# Patient Record
Sex: Male | Born: 1950
Health system: Southern US, Community
[De-identification: ages and names within clinical notes are randomized; demographics above are authoritative.]

## PROBLEM LIST (undated history)

## (undated) DIAGNOSIS — Z973 Presence of spectacles and contact lenses: Secondary | ICD-10-CM

## (undated) DIAGNOSIS — I10 Essential (primary) hypertension: Secondary | ICD-10-CM

## (undated) DIAGNOSIS — T7840XA Allergy, unspecified, initial encounter: Secondary | ICD-10-CM

## (undated) DIAGNOSIS — Z87442 Personal history of urinary calculi: Secondary | ICD-10-CM

## (undated) DIAGNOSIS — E785 Hyperlipidemia, unspecified: Secondary | ICD-10-CM

## (undated) DIAGNOSIS — E119 Type 2 diabetes mellitus without complications: Secondary | ICD-10-CM

## (undated) DIAGNOSIS — S24104A Unspecified injury at T11-T12 level of thoracic spinal cord, initial encounter: Secondary | ICD-10-CM

## (undated) DIAGNOSIS — G8220 Paraplegia, unspecified: Secondary | ICD-10-CM

## (undated) DIAGNOSIS — Z993 Dependence on wheelchair: Secondary | ICD-10-CM

## (undated) DIAGNOSIS — N433 Hydrocele, unspecified: Secondary | ICD-10-CM

## (undated) DIAGNOSIS — M1990 Unspecified osteoarthritis, unspecified site: Secondary | ICD-10-CM

## (undated) HISTORY — DX: Paraplegia, unspecified: G82.20

## (undated) HISTORY — DX: Essential (primary) hypertension: I10

## (undated) HISTORY — DX: Hyperlipidemia, unspecified: E78.5

## (undated) HISTORY — DX: Personal history of urinary calculi: Z87.442

## (undated) HISTORY — PX: OTHER SURGICAL HISTORY: SHX169

## (undated) HISTORY — DX: Allergy, unspecified, initial encounter: T78.40XA

---

## 1978-02-09 HISTORY — PX: OTHER SURGICAL HISTORY: SHX169

## 2004-02-14 ENCOUNTER — Ambulatory Visit: Payer: Self-pay | Admitting: Family Medicine

## 2004-03-11 ENCOUNTER — Ambulatory Visit: Payer: Self-pay | Admitting: Family Medicine

## 2004-05-19 ENCOUNTER — Ambulatory Visit: Payer: Self-pay | Admitting: Family Medicine

## 2007-11-09 ENCOUNTER — Encounter: Payer: Self-pay | Admitting: Family Medicine

## 2008-01-16 ENCOUNTER — Ambulatory Visit: Payer: Self-pay | Admitting: *Deleted

## 2008-01-16 ENCOUNTER — Ambulatory Visit: Admission: RE | Admit: 2008-01-16 | Discharge: 2008-01-16 | Payer: Self-pay | Admitting: Family Medicine

## 2008-01-16 ENCOUNTER — Ambulatory Visit: Payer: Self-pay | Admitting: Family Medicine

## 2008-01-16 ENCOUNTER — Encounter: Payer: Self-pay | Admitting: Family Medicine

## 2008-01-16 DIAGNOSIS — Z87442 Personal history of urinary calculi: Secondary | ICD-10-CM | POA: Insufficient documentation

## 2008-01-16 DIAGNOSIS — R519 Headache, unspecified: Secondary | ICD-10-CM | POA: Insufficient documentation

## 2008-01-16 DIAGNOSIS — J309 Allergic rhinitis, unspecified: Secondary | ICD-10-CM | POA: Insufficient documentation

## 2008-01-16 DIAGNOSIS — R609 Edema, unspecified: Secondary | ICD-10-CM | POA: Insufficient documentation

## 2008-01-16 DIAGNOSIS — R51 Headache: Secondary | ICD-10-CM | POA: Insufficient documentation

## 2008-01-16 DIAGNOSIS — I10 Essential (primary) hypertension: Secondary | ICD-10-CM | POA: Insufficient documentation

## 2008-01-17 ENCOUNTER — Telehealth (INDEPENDENT_AMBULATORY_CARE_PROVIDER_SITE_OTHER): Payer: Self-pay | Admitting: *Deleted

## 2008-01-18 ENCOUNTER — Telehealth: Payer: Self-pay | Admitting: Family Medicine

## 2008-01-25 ENCOUNTER — Telehealth: Payer: Self-pay | Admitting: Family Medicine

## 2008-04-06 ENCOUNTER — Ambulatory Visit: Payer: Self-pay | Admitting: Family Medicine

## 2008-05-24 ENCOUNTER — Ambulatory Visit: Payer: Self-pay | Admitting: Family Medicine

## 2008-05-31 ENCOUNTER — Telehealth: Payer: Self-pay | Admitting: Family Medicine

## 2008-05-31 LAB — CONVERTED CEMR LAB
Alkaline Phosphatase: 86 units/L (ref 39–117)
BUN: 18 mg/dL (ref 6–23)
Basophils Absolute: 0.2 10*3/uL — ABNORMAL HIGH (ref 0.0–0.1)
Bilirubin, Direct: 0.1 mg/dL (ref 0.0–0.3)
CO2: 28 meq/L (ref 19–32)
Calcium: 9.3 mg/dL (ref 8.4–10.5)
Cholesterol: 207 mg/dL — ABNORMAL HIGH (ref 0–200)
Creatinine, Ser: 0.8 mg/dL (ref 0.4–1.5)
Direct LDL: 154.6 mg/dL
Eosinophils Absolute: 0.1 10*3/uL (ref 0.0–0.7)
Glucose, Bld: 159 mg/dL — ABNORMAL HIGH (ref 70–99)
Lymphocytes Relative: 23.2 % (ref 12.0–46.0)
MCHC: 34.7 g/dL (ref 30.0–36.0)
MCV: 85.1 fL (ref 78.0–100.0)
Monocytes Absolute: 0.3 10*3/uL (ref 0.1–1.0)
Neutrophils Relative %: 67.3 % (ref 43.0–77.0)
PSA: 2.23 ng/mL (ref 0.10–4.00)
Platelets: 247 10*3/uL (ref 150.0–400.0)
RBC: 4.67 M/uL (ref 4.22–5.81)
RDW: 12.3 % (ref 11.5–14.6)
Total Bilirubin: 1 mg/dL (ref 0.3–1.2)
Total CHOL/HDL Ratio: 6
Triglycerides: 116 mg/dL (ref 0.0–149.0)

## 2008-06-05 ENCOUNTER — Ambulatory Visit: Payer: Self-pay | Admitting: Family Medicine

## 2008-06-05 DIAGNOSIS — E119 Type 2 diabetes mellitus without complications: Secondary | ICD-10-CM | POA: Insufficient documentation

## 2008-06-05 DIAGNOSIS — E782 Mixed hyperlipidemia: Secondary | ICD-10-CM | POA: Insufficient documentation

## 2008-06-08 LAB — CONVERTED CEMR LAB
Creatinine,U: 71 mg/dL
Hgb A1c MFr Bld: 7.2 % — ABNORMAL HIGH (ref 4.6–6.5)
Microalb Creat Ratio: 5.6 mg/g (ref 0.0–30.0)

## 2008-06-11 ENCOUNTER — Ambulatory Visit: Payer: Self-pay | Admitting: Gastroenterology

## 2008-06-21 ENCOUNTER — Encounter: Admission: RE | Admit: 2008-06-21 | Discharge: 2008-06-21 | Payer: Self-pay | Admitting: Family Medicine

## 2008-06-25 ENCOUNTER — Ambulatory Visit: Payer: Self-pay | Admitting: Gastroenterology

## 2008-06-25 ENCOUNTER — Encounter: Payer: Self-pay | Admitting: Gastroenterology

## 2008-06-25 HISTORY — PX: COLONOSCOPY: SHX174

## 2008-06-26 ENCOUNTER — Encounter: Payer: Self-pay | Admitting: Gastroenterology

## 2008-06-26 DIAGNOSIS — K626 Ulcer of anus and rectum: Secondary | ICD-10-CM | POA: Insufficient documentation

## 2008-06-28 ENCOUNTER — Encounter: Payer: Self-pay | Admitting: Gastroenterology

## 2008-07-10 ENCOUNTER — Encounter: Payer: Self-pay | Admitting: Gastroenterology

## 2008-07-31 ENCOUNTER — Ambulatory Visit (HOSPITAL_BASED_OUTPATIENT_CLINIC_OR_DEPARTMENT_OTHER): Admission: RE | Admit: 2008-07-31 | Discharge: 2008-07-31 | Payer: Self-pay | Admitting: Surgery

## 2008-07-31 ENCOUNTER — Encounter (INDEPENDENT_AMBULATORY_CARE_PROVIDER_SITE_OTHER): Payer: Self-pay | Admitting: Surgery

## 2008-07-31 HISTORY — PX: OTHER SURGICAL HISTORY: SHX169

## 2009-05-15 ENCOUNTER — Ambulatory Visit: Payer: Self-pay | Admitting: Family Medicine

## 2009-05-15 LAB — CONVERTED CEMR LAB
Ketones, urine, test strip: NEGATIVE
Nitrite: NEGATIVE
Specific Gravity, Urine: 1.02
Urobilinogen, UA: 0.2

## 2009-05-16 ENCOUNTER — Encounter: Payer: Self-pay | Admitting: Family Medicine

## 2009-05-17 LAB — CONVERTED CEMR LAB
ALT: 27 units/L (ref 0–53)
AST: 21 units/L (ref 0–37)
Albumin: 3.7 g/dL (ref 3.5–5.2)
Alkaline Phosphatase: 76 units/L (ref 39–117)
Basophils Relative: 0.5 % (ref 0.0–3.0)
Bilirubin, Direct: 0.1 mg/dL (ref 0.0–0.3)
CO2: 27 meq/L (ref 19–32)
Calcium: 8.5 mg/dL (ref 8.4–10.5)
Creatinine, Ser: 0.8 mg/dL (ref 0.4–1.5)
Eosinophils Relative: 2.6 % (ref 0.0–5.0)
HDL: 37.6 mg/dL — ABNORMAL LOW (ref >39.00)
Hemoglobin: 13.6 g/dL (ref 13.0–17.0)
Hgb A1c MFr Bld: 6.6 % — ABNORMAL HIGH (ref 4.6–6.5)
LDL Cholesterol: 113 mg/dL — ABNORMAL HIGH (ref 0–99)
Lymphocytes Relative: 21.3 % (ref 12.0–46.0)
Microalb, Ur: 9.5 mg/dL — ABNORMAL HIGH (ref 0.0–1.9)
Monocytes Relative: 4.6 % (ref 3.0–12.0)
Neutro Abs: 4.1 10*3/uL (ref 1.4–7.7)
Neutrophils Relative %: 71 % (ref 43.0–77.0)
RBC: 4.57 M/uL (ref 4.22–5.81)
Sodium: 139 meq/L (ref 135–145)
Total CHOL/HDL Ratio: 5
Total Protein: 7.1 g/dL (ref 6.0–8.3)
Triglycerides: 95 mg/dL (ref 0.0–149.0)
WBC: 5.8 10*3/uL (ref 4.5–10.5)

## 2009-05-27 ENCOUNTER — Ambulatory Visit: Payer: Self-pay | Admitting: Family Medicine

## 2010-03-11 NOTE — Assessment & Plan Note (Signed)
Summary: cpx//ccm   Vital Signs:  Patient profile:   61 year old male Weight:      230 pounds BMI:     31.31 Temp:     98.6 degrees F oral BP sitting:   130 / 90  (right arm) Cuff size:   regular  Vitals Entered By: Raechel Ache, RN (May 27, 2009 9:23 AM) CC: CPX, labs done. Needs refills.   History of Present Illness: 60 yr old male for a cpx. In general he feels well. His wife was recently found to have heart disease, and both of them have started eating a more healthy diet lately.   Allergies (verified): No Known Drug Allergies  Past History:  Past Medical History: Reviewed history from 06/05/2008 and no changes required. Allergic rhinitis Headache Nephrolithiasis, hx of Hypertension paraplegia, wheelchair bound Diabetes mellitus, type II Hyperlipidemia  Past Surgical History: Lysis of the spinal cord at T12 caused from falling out of a tree in 1980 Vasectomy colonoscopy 06-25-08 per Dr. Wendall Papa, benign polyp, repeat 10 years  Hemorrhoidectomy with biopsy of a benign anal ulcer June 2010 per Dr. Manus Rudd  Family History: Reviewed history from 01/16/2008 and no changes required. Family History of CAD Male 1st degree relative <50 Family History Diabetes 1st degree relative Family History Hypertension Family History Liver disease Family History of Stroke F 1st degree relative <60 Family History Thyroid disease  Social History: Reviewed history from 01/16/2008 and no changes required. Married Current Smoker Alcohol use-yes Drug use-no  Review of Systems  The patient denies anorexia, fever, weight loss, weight gain, vision loss, decreased hearing, hoarseness, chest pain, syncope, dyspnea on exertion, peripheral edema, prolonged cough, headaches, hemoptysis, abdominal pain, melena, hematochezia, severe indigestion/heartburn, hematuria, incontinence, genital sores, muscle weakness, suspicious skin lesions, transient blindness, depression, unusual  weight change, abnormal bleeding, enlarged lymph nodes, angioedema, breast masses, and testicular masses.    Physical Exam  General:  overweight-appearing.  in a wheelchair but he can stand briefly Head:  Normocephalic and atraumatic without obvious abnormalities. No apparent alopecia or balding. Eyes:  No corneal or conjunctival inflammation noted. EOMI. Perrla. Funduscopic exam benign, without hemorrhages, exudates or papilledema. Vision grossly normal. Ears:  External ear exam shows no significant lesions or deformities.  Otoscopic examination reveals clear canals, tympanic membranes are intact bilaterally without bulging, retraction, inflammation or discharge. Hearing is grossly normal bilaterally. Nose:  External nasal examination shows no deformity or inflammation. Nasal mucosa are pink and moist without lesions or exudates. Mouth:  Oral mucosa and oropharynx without lesions or exudates.  Teeth in good repair. Neck:  No deformities, masses, or tenderness noted. Chest Wall:  No deformities, masses, tenderness or gynecomastia noted. Lungs:  Normal respiratory effort, chest expands symmetrically. Lungs are clear to auscultation, no crackles or wheezes. Heart:  Normal rate and regular rhythm. S1 and S2 normal without gallop, murmur, click, rub or other extra sounds. EKG normal Abdomen:  Bowel sounds positive,abdomen soft and non-tender without masses, organomegaly or hernias noted. Rectal:  No external abnormalities noted. Normal sphincter tone. No rectal masses or tenderness. Heme neg.  Genitalia:  Testes bilaterally descended without nodularity, tenderness or masses. No scrotal masses or lesions. No penis lesions or urethral discharge. Prostate:  Prostate gland firm and smooth, no enlargement, nodularity, tenderness, mass, asymmetry or induration. Msk:  No deformity or scoliosis noted of thoracic or lumbar spine.   Pulses:  R and L carotid,radial,femoral,dorsalis pedis and posterior tibial  pulses are full and equal bilaterally Extremities:  No clubbing, cyanosis, edema, or deformity noted with normal full range of motion of all joints.   Neurologic:  alert & oriented X3 and cranial nerves II-XII intact. Strength and sensation are intact to the arms, legs show 2/5 for motor strength and decreased light touch sensation. DTRs intact to the arms but not the legs.  Skin:  Intact without suspicious lesions or rashes Cervical Nodes:  No lymphadenopathy noted Axillary Nodes:  No palpable lymphadenopathy Inguinal Nodes:  No significant adenopathy Psych:  Cognition and judgment appear intact. Alert and cooperative with normal attention span and concentration. No apparent delusions, illusions, hallucinations   Impression & Recommendations:  Problem # 1:  WELL ADULT EXAM (ICD-V70.0)  Orders: Hemoccult Guaiac-1 spec.(in office) (82270) EKG w/ Interpretation (93000)  Complete Medication List: 1)  Tarka 4-240 Mg Tbcr (Trandolapril-verapamil hcl) .Marland Kitchen.. 1 by mouth once daily 2)  Metoprolol Tartrate 100 Mg Tabs (Metoprolol tartrate) .Marland Kitchen.. 1 by mouth two times a day 3)  Hydrochlorothiazide 25 Mg Tabs (Hydrochlorothiazide) .... Once daily 4)  Bayer Aspirin 325 Mg Tabs (Aspirin) .... Once daily 5)  Doxycycline Hyclate 100 Mg Caps (Doxycycline hyclate) .... Two times a day  Patient Instructions: 1)  Watch diet closely and recheck lipids and A1c in 6 months.  Prescriptions: DOXYCYCLINE HYCLATE 100 MG CAPS (DOXYCYCLINE HYCLATE) two times a day  #28 x 0   Entered and Authorized by:   Nelwyn Salisbury MD   Signed by:   Nelwyn Salisbury MD on 05/27/2009   Method used:   Electronically to        [facility] [address] [phone]* (retail)       [address] [place], [place]  [address]       Ph: [phone]       Fax: [phone]   RxID:   [id number] HYDROCHLOROTHIAZIDE 25 MG TABS (HYDROCHLOROTHIAZIDE) once daily  #30 x 11   Entered and Authorized by:   Nelwyn Salisbury MD   Signed by:   Nelwyn Salisbury  MD on 05/27/2009   Method used:   Electronically to        [facility] [address] [phone]* (retail)       [address] [place], [place]  [address]       Ph: [phone]       Fax: [phone]   RxID:   [id number] METOPROLOL TARTRATE 100 MG  TABS (METOPROLOL TARTRATE) 1 by mouth two times a day  #60 x 11   Entered and Authorized by:   Nelwyn Salisbury MD   Signed by:   Nelwyn Salisbury MD on 05/27/2009   Method used:   Electronically to        [facility] [address] [phone]* (retail)       [address] [place], [place]  [address]       Ph: [phone]       Fax: [phone]   RxID:   [id number] TARKA 4-240 MG  TBCR (TRANDOLAPRIL-VERAPAMIL HCL) 1 by mouth once daily  #30 x 11   Entered and Authorized by:   Nelwyn Salisbury MD   Signed by:   Nelwyn Salisbury MD on 05/27/2009   Method used:   Electronically to        [facility] [address] [phone]* (retail)       [address]  [place], [place]  [address]       Ph: [phone]       Fax: [phone]   RxID:   [id number]

## 2010-05-19 LAB — BASIC METABOLIC PANEL
BUN: 16 mg/dL (ref 6–23)
Chloride: 103 mEq/L (ref 96–112)
Creatinine, Ser: 0.85 mg/dL (ref 0.4–1.5)
Glucose, Bld: 207 mg/dL — ABNORMAL HIGH (ref 70–99)

## 2010-05-19 LAB — CBC
HCT: 39.2 % (ref 39.0–52.0)
MCHC: 34.3 g/dL (ref 30.0–36.0)
MCV: 85 fL (ref 78.0–100.0)
Platelets: 249 10*3/uL (ref 150–400)
RDW: 13.6 % (ref 11.5–15.5)
WBC: 5.9 10*3/uL (ref 4.0–10.5)

## 2010-05-19 LAB — DIFFERENTIAL
Basophils Absolute: 0 10*3/uL (ref 0.0–0.1)
Basophils Relative: 1 % (ref 0–1)
Eosinophils Absolute: 0.1 10*3/uL (ref 0.0–0.7)
Eosinophils Relative: 2 % (ref 0–5)
Neutrophils Relative %: 71 % (ref 43–77)

## 2010-05-19 LAB — POCT HEMOGLOBIN-HEMACUE: Hemoglobin: 12.7 g/dL — ABNORMAL LOW (ref 13.0–17.0)

## 2010-05-28 ENCOUNTER — Other Ambulatory Visit: Payer: Self-pay | Admitting: Family Medicine

## 2010-06-24 NOTE — Op Note (Signed)
NAMEBRENNER, VISCONTI                  ACCOUNT NO.:  [id number]   MEDICAL RECORD NO.:  [id number]          PATIENT TYPE:  AMB   LOCATION:  [facility]                          FACILITY:  [facility]   PHYSICIAN:  Wilmon Arms. Corliss Skains, M.D. DATE OF BIRTH:  01-15-1951   DATE OF PROCEDURE:  07/31/2008  DATE OF DISCHARGE:                               OPERATIVE REPORT   PREOPERATIVE DIAGNOSIS:  Enlarged internal and external hemorrhoid.   POSTOPERATIVE DIAGNOSES:  1. Enlarged internal and external hemorrhoid.  2. Perianal skin lesion.   PROCEDURE PERFORMED:  1. Single-column internal and external hemorrhoidectomy.  2. Excision of perianal skin lesion.   SURGEON:  Wilmon Arms. Corliss Skains, MD   ANESTHESIA:  General.   INDICATIONS:  This is a 60 year old male who has lower extremity  paraplegia from a spinal injury many years ago.  He recently underwent a  screening colonoscopy.  He was noted to have a mucosal ulceration and  skin lesion just inside a large hemorrhoid.  He is referred for  hemorrhoidectomy and biopsy.   DESCRIPTION OF PROCEDURE:  The patient was brought to the operating room  and placed in the supine position on the operating table.  After an  adequate level of general anesthesia was obtained, the patient's legs  were placed in the lithotomy position.  His perineum was prepped with  Betadine and draped in a sterile fashion.  He was noted to have a 2-cm  protruding skin lesion posterior to his anus.  We infiltrated the area  around the anus and the intersphincteric groove with 0.25% Marcaine with  epinephrine.  Well also injected some around the skin lesion  posteriorly.  We inserted a silver bullet retractor and did a  circumferential visual examination.  In the left posterior region, the  patient has a large prolapsing internal and external hemorrhoid.  At the  apex of this area, he has a whitish plaque-like lesion on the mucosa.  We made decision to perform a single-column hemorrhoidectomy  including  this mucosal lesion.  We placed a 3-0 Vicryl suture above the mucosal  lesion.  The mucosa and the anoderm were scored with a knife.  Cautery  was then used to dissect the hemorrhoid tissue off the sphincter  muscles.  Hemostasis was then obtained with cautery.  A 3-0 Vicryl was  used to reapproximate the mucosal edges.  We used another 3-0 Vicryl in  the anoderm.  Gelfoam was packed in the anal canal.  We then excised the  skin lesion posteriorly.  This  had a fairly wide base and we decided to just leave it open.  Both  specimens were sent for pathologic examination.  The patient was then  extubated and brought to the recovery room in stable condition.  All  sponge, instrument, and needle counts were correct.      Wilmon Arms. Tsuei, M.D.  Electronically Signed     MKT/MEDQ  D:  07/31/2008  T:  07/31/2008  Job:  [id number]

## 2010-06-27 ENCOUNTER — Other Ambulatory Visit: Payer: Self-pay | Admitting: Family Medicine

## 2010-07-26 ENCOUNTER — Other Ambulatory Visit: Payer: Self-pay | Admitting: Family Medicine

## 2010-08-25 ENCOUNTER — Other Ambulatory Visit: Payer: Self-pay | Admitting: Family Medicine

## 2010-09-26 ENCOUNTER — Other Ambulatory Visit: Payer: Self-pay | Admitting: Family Medicine

## 2010-10-24 ENCOUNTER — Other Ambulatory Visit: Payer: Self-pay | Admitting: Family Medicine

## 2010-10-24 NOTE — Telephone Encounter (Signed)
Spoke with pt and he is going to schedule a office visit.

## 2010-11-19 ENCOUNTER — Other Ambulatory Visit (INDEPENDENT_AMBULATORY_CARE_PROVIDER_SITE_OTHER): Payer: BC Managed Care – PPO

## 2010-11-19 DIAGNOSIS — Z0000 Encounter for general adult medical examination without abnormal findings: Secondary | ICD-10-CM

## 2010-11-19 LAB — BASIC METABOLIC PANEL
Calcium: 8.9 mg/dL (ref 8.4–10.5)
Creatinine, Ser: 0.7 mg/dL (ref 0.4–1.5)
GFR: 115.01 mL/min (ref >60.00)
Sodium: 135 mEq/L (ref 135–145)

## 2010-11-19 LAB — HEPATIC FUNCTION PANEL
AST: 19 U/L (ref 0–37)
Albumin: 4.1 g/dL (ref 3.5–5.2)
Alkaline Phosphatase: 81 U/L (ref 39–117)
Bilirubin, Direct: 0.1 mg/dL (ref 0.0–0.3)
Total Protein: 7.4 g/dL (ref 6.0–8.3)

## 2010-11-19 LAB — POCT URINALYSIS DIPSTICK
Ketones, UA: NEGATIVE
Spec Grav, UA: 1.02
Urobilinogen, UA: 0.2
pH, UA: 5.5

## 2010-11-19 LAB — CBC WITH DIFFERENTIAL/PLATELET
Basophils Absolute: 0 10*3/uL (ref 0.0–0.1)
Basophils Relative: 0.6 % (ref 0.0–3.0)
Eosinophils Absolute: 0.1 10*3/uL (ref 0.0–0.7)
Hemoglobin: 13.2 g/dL (ref 13.0–17.0)
Lymphocytes Relative: 25.2 % (ref 12.0–46.0)
MCHC: 33.3 g/dL (ref 30.0–36.0)
Monocytes Relative: 5.5 % (ref 3.0–12.0)
Neutro Abs: 3.9 10*3/uL (ref 1.4–7.7)
Neutrophils Relative %: 66.8 % (ref 43.0–77.0)
RBC: 4.61 Mil/uL (ref 4.22–5.81)

## 2010-11-19 LAB — LIPID PANEL
HDL: 40.9 mg/dL (ref >39.00)
Total CHOL/HDL Ratio: 5
Triglycerides: 116 mg/dL (ref 0.0–149.0)

## 2010-11-19 LAB — MICROALBUMIN / CREATININE URINE RATIO
Creatinine,U: 119 mg/dL
Microalb, Ur: 16.5 mg/dL — ABNORMAL HIGH (ref 0.0–1.9)

## 2010-11-21 ENCOUNTER — Telehealth: Payer: Self-pay | Admitting: Family Medicine

## 2010-11-21 MED ORDER — CIPROFLOXACIN HCL 500 MG PO TABS: 500.0000 mg | ORAL_TABLET | Freq: Two times a day (BID) | ORAL | Status: AC

## 2010-11-21 NOTE — Telephone Encounter (Signed)
Spoke with pt and gave results, also sent script e-scribe.

## 2010-11-21 NOTE — Progress Notes (Signed)
Addended by: Aniceto Boss A on: 11/21/2010 02:29 PM   Modules accepted: Orders

## 2010-11-21 NOTE — Telephone Encounter (Signed)
Message copied by Baldemar Friday on Fri Nov 21, 2010  2:29 PM ------      Message from: Gershon Crane A      Created: Thu Nov 20, 2010  8:36 AM       His diabetes is out of control, his chol is too high, and he has a UTI. Call in Cipro 500 mg bid for 10 days. We will discuss the other things at the cpx

## 2010-11-26 ENCOUNTER — Ambulatory Visit (INDEPENDENT_AMBULATORY_CARE_PROVIDER_SITE_OTHER): Payer: BC Managed Care – PPO | Admitting: Family Medicine

## 2010-11-26 ENCOUNTER — Encounter: Payer: Self-pay | Admitting: Family Medicine

## 2010-11-26 VITALS — BP 122/80 | HR 62 | Temp 98.6°F | Wt 229.0 lb

## 2010-11-26 DIAGNOSIS — Z136 Encounter for screening for cardiovascular disorders: Secondary | ICD-10-CM

## 2010-11-26 DIAGNOSIS — Z0000 Encounter for general adult medical examination without abnormal findings: Secondary | ICD-10-CM

## 2010-11-26 DIAGNOSIS — Z23 Encounter for immunization: Secondary | ICD-10-CM

## 2010-11-26 MED ORDER — AMLODIPINE BESYLATE 10 MG PO TABS: 10.0000 mg | ORAL_TABLET | Freq: Every day | ORAL | Status: DC

## 2010-11-26 MED ORDER — HYDROCHLOROTHIAZIDE 25 MG PO TABS: 25.0000 mg | ORAL_TABLET | Freq: Every day | ORAL | Status: DC

## 2010-11-26 MED ORDER — METOPROLOL TARTRATE 100 MG PO TABS: 100.0000 mg | ORAL_TABLET | Freq: Two times a day (BID) | ORAL | Status: DC

## 2010-11-26 MED ORDER — TRANDOLAPRIL-VERAPAMIL HCL ER 4-240 MG PO TBCR: 1.0000 | EXTENDED_RELEASE_TABLET | Freq: Every day | ORAL | Status: DC

## 2010-11-26 MED ORDER — METFORMIN HCL 1000 MG PO TABS: 1000.0000 mg | ORAL_TABLET | Freq: Two times a day (BID) | ORAL | Status: DC

## 2010-11-26 MED ORDER — ATORVASTATIN CALCIUM 20 MG PO TABS: 20.0000 mg | ORAL_TABLET | Freq: Every day | ORAL | Status: DC

## 2010-11-26 NOTE — Progress Notes (Signed)
  Subjective:    Patient ID: Dwayne Simpson, male    DOB: 08/15/1950, 60 y.o.   MRN: [id number]  HPI 60 yr old male for a cpx. He feels fine and has no complaints. He works full time and tries to exercise at the gym.    Review of Systems  Eyes: Negative.   Respiratory: Negative.   Cardiovascular: Negative.   Gastrointestinal: Negative.   Genitourinary: Negative.   Musculoskeletal: Negative.   Skin: Negative.   Neurological: Positive for weakness. Negative for dizziness, tremors, seizures, syncope, facial asymmetry, speech difficulty, light-headedness, numbness and headaches.  Hematological: Negative.   Psychiatric/Behavioral: Negative.        Objective:   Physical Exam  Constitutional: He is oriented to person, place, and time. He appears well-developed and well-nourished. No distress.       In his wheelchair   HENT:  Head: Normocephalic and atraumatic.  Right Ear: External ear normal.  Left Ear: External ear normal.  Nose: Nose normal.  Mouth/Throat: Oropharynx is clear and moist. No oropharyngeal exudate.  Eyes: Conjunctivae and EOM are normal. Pupils are equal, round, and reactive to light. Right eye exhibits no discharge. Left eye exhibits no discharge. No scleral icterus.  Neck: Neck supple. No JVD present. No tracheal deviation present. No thyromegaly present.  Cardiovascular: Normal rate, regular rhythm, normal heart sounds and intact distal pulses.  Exam reveals no gallop and no friction rub.   No murmur heard.      EKG normal  Pulmonary/Chest: Effort normal and breath sounds normal. No respiratory distress. He has no wheezes. He has no rales. He exhibits no tenderness.  Abdominal: Soft. Bowel sounds are normal. He exhibits no distension and no mass. There is no tenderness. There is no rebound and no guarding.  Genitourinary: Rectum normal, prostate normal and penis normal. Guaiac negative stool. No penile tenderness.  Musculoskeletal: Normal range of motion. He exhibits  no edema and no tenderness.  Lymphadenopathy:    He has no cervical adenopathy.  Neurological: He is alert and oriented to person, place, and time. He has normal reflexes. No cranial nerve deficit. He exhibits abnormal muscle tone. Coordination normal.  Skin: Skin is warm and dry. No rash noted. He is not diaphoretic. No erythema. No pallor.  Psychiatric: He has a normal mood and affect. His behavior is normal. Judgment and thought content normal.          Assessment & Plan:  Well exam. He is taking Cipro for a UTI. Start on Lipitor for the lipids and on Metformin for the diabetes. Recheck labs in 90 days

## 2011-04-23 ENCOUNTER — Other Ambulatory Visit: Payer: Self-pay | Admitting: Family Medicine

## 2011-05-25 ENCOUNTER — Other Ambulatory Visit: Payer: Self-pay | Admitting: Family Medicine

## 2011-11-19 ENCOUNTER — Other Ambulatory Visit: Payer: Self-pay | Admitting: Family Medicine

## 2011-12-09 ENCOUNTER — Other Ambulatory Visit (INDEPENDENT_AMBULATORY_CARE_PROVIDER_SITE_OTHER): Payer: BC Managed Care – PPO

## 2011-12-09 DIAGNOSIS — Z0000 Encounter for general adult medical examination without abnormal findings: Secondary | ICD-10-CM

## 2011-12-09 LAB — CBC WITH DIFFERENTIAL/PLATELET
Basophils Absolute: 0 10*3/uL (ref 0.0–0.1)
Eosinophils Absolute: 0.2 10*3/uL (ref 0.0–0.7)
Lymphocytes Relative: 28.8 % (ref 12.0–46.0)
MCHC: 33.7 g/dL (ref 30.0–36.0)
MCV: 84.8 fl (ref 78.0–100.0)
Monocytes Absolute: 0.3 10*3/uL (ref 0.1–1.0)
Neutrophils Relative %: 61.7 % (ref 43.0–77.0)
Platelets: 234 10*3/uL (ref 150.0–400.0)

## 2011-12-09 LAB — HEPATIC FUNCTION PANEL
Alkaline Phosphatase: 70 U/L (ref 39–117)
Bilirubin, Direct: 0.2 mg/dL (ref 0.0–0.3)
Total Bilirubin: 1.1 mg/dL (ref 0.3–1.2)
Total Protein: 7.2 g/dL (ref 6.0–8.3)

## 2011-12-09 LAB — MICROALBUMIN / CREATININE URINE RATIO: Microalb Creat Ratio: 1.5 mg/g (ref 0.0–30.0)

## 2011-12-09 LAB — POCT URINALYSIS DIPSTICK
Blood, UA: NEGATIVE
Glucose, UA: NEGATIVE
Leukocytes, UA: NEGATIVE
Nitrite, UA: NEGATIVE
Urobilinogen, UA: 0.2

## 2011-12-09 LAB — BASIC METABOLIC PANEL
BUN: 19 mg/dL (ref 6–23)
CO2: 25 mEq/L (ref 19–32)
Calcium: 9.1 mg/dL (ref 8.4–10.5)
Chloride: 103 mEq/L (ref 96–112)
Creatinine, Ser: 0.8 mg/dL (ref 0.4–1.5)

## 2011-12-09 LAB — TSH: TSH: 1.09 u[IU]/mL (ref 0.35–5.50)

## 2011-12-09 LAB — PSA: PSA: 0.87 ng/mL (ref 0.10–4.00)

## 2011-12-09 LAB — LIPID PANEL
Total CHOL/HDL Ratio: 4
Triglycerides: 107 mg/dL (ref 0.0–149.0)

## 2011-12-11 NOTE — Progress Notes (Signed)
Quick Note:  I spoke with pt ______ 

## 2011-12-16 ENCOUNTER — Ambulatory Visit (INDEPENDENT_AMBULATORY_CARE_PROVIDER_SITE_OTHER): Payer: BC Managed Care – PPO | Admitting: Family Medicine

## 2011-12-16 ENCOUNTER — Encounter: Payer: Self-pay | Admitting: Family Medicine

## 2011-12-16 VITALS — BP 118/72 | HR 62 | Temp 98.6°F

## 2011-12-16 DIAGNOSIS — N5089 Other specified disorders of the male genital organs: Secondary | ICD-10-CM

## 2011-12-16 DIAGNOSIS — Z0000 Encounter for general adult medical examination without abnormal findings: Secondary | ICD-10-CM

## 2011-12-16 DIAGNOSIS — Z23 Encounter for immunization: Secondary | ICD-10-CM

## 2011-12-16 MED ORDER — AMLODIPINE BESYLATE 10 MG PO TABS: 10.0000 mg | ORAL_TABLET | Freq: Every day | ORAL | Status: DC

## 2011-12-16 MED ORDER — METFORMIN HCL 1000 MG PO TABS: 1000.0000 mg | ORAL_TABLET | Freq: Two times a day (BID) | ORAL | Status: DC

## 2011-12-16 MED ORDER — METOPROLOL TARTRATE 100 MG PO TABS: 100.0000 mg | ORAL_TABLET | Freq: Two times a day (BID) | ORAL | Status: DC

## 2011-12-16 MED ORDER — ATORVASTATIN CALCIUM 20 MG PO TABS: 20.0000 mg | ORAL_TABLET | Freq: Every day | ORAL | Status: DC

## 2011-12-16 MED ORDER — ASPIRIN 81 MG PO TABS: 81.0000 mg | ORAL_TABLET | Freq: Every day | ORAL | Status: DC

## 2011-12-16 MED ORDER — HYDROCHLOROTHIAZIDE 25 MG PO TABS: 25.0000 mg | ORAL_TABLET | Freq: Every day | ORAL | Status: DC

## 2011-12-16 MED ORDER — LISINOPRIL 20 MG PO TABS: 20.0000 mg | ORAL_TABLET | Freq: Every day | ORAL | Status: DC

## 2011-12-16 NOTE — Progress Notes (Signed)
  Subjective:    Patient ID: Dwayne Simpson, male    DOB: 1950-11-01, 61 y.o.   MRN: [id number]  HPI 61 yr old male for a cpx. He feels well and has no concerns. On our exam below we found a large scrotal swelling. He is unaware of this and he does not know how long it has been present. It does not bother him.    Review of Systems  Constitutional: Negative.   HENT: Negative.   Eyes: Negative.   Respiratory: Negative.   Cardiovascular: Negative.   Gastrointestinal: Negative.   Genitourinary: Negative.   Musculoskeletal: Negative.   Skin: Negative.   Neurological: Negative.   Hematological: Negative.   Psychiatric/Behavioral: Negative.        Objective:   Physical Exam  Constitutional: He is oriented to person, place, and time. He appears well-developed and well-nourished. No distress.  HENT:  Head: Normocephalic and atraumatic.  Right Ear: External ear normal.  Left Ear: External ear normal.  Nose: Nose normal.  Mouth/Throat: Oropharynx is clear and moist. No oropharyngeal exudate.  Eyes: Conjunctivae normal and EOM are normal. Pupils are equal, round, and reactive to light. Right eye exhibits no discharge. Left eye exhibits no discharge. No scleral icterus.  Neck: Neck supple. No JVD present. No tracheal deviation present. No thyromegaly present.  Cardiovascular: Normal rate, regular rhythm, normal heart sounds and intact distal pulses.  Exam reveals no gallop and no friction rub.   No murmur heard. Pulmonary/Chest: Effort normal and breath sounds normal. No respiratory distress. He has no wheezes. He has no rales. He exhibits no tenderness.  Abdominal: Soft. Bowel sounds are normal. He exhibits no distension and no mass. There is no tenderness. There is no rebound and no guarding.  Genitourinary: Rectum normal, prostate normal and penis normal. Guaiac negative stool. No penile tenderness.       There is a large cystic mass in the right scrotum which is not tender. I cannot  palpate the right testicle. This seems to be a hydrocele.  Musculoskeletal: Normal range of motion. He exhibits no edema and no tenderness.  Lymphadenopathy:    He has no cervical adenopathy.  Neurological: He is alert and oriented to person, place, and time. He has normal reflexes. No cranial nerve deficit. He exhibits normal muscle tone. Coordination normal.  Skin: Skin is warm and dry. No rash noted. He is not diaphoretic. No erythema. No pallor.  Psychiatric: He has a normal mood and affect. His behavior is normal. Judgment and thought content normal.          Assessment & Plan:  Well exam. He will continue with dietary and exercise efforts. Set up a scrotal US to evaluate the scrotal mass.

## 2011-12-17 ENCOUNTER — Encounter: Payer: Self-pay | Admitting: Family Medicine

## 2011-12-17 NOTE — Progress Notes (Signed)
  Subjective:    Patient ID: Dwayne Simpson, male    DOB: 04-22-1950, 61 y.o.   MRN: [id number]  HPI    Review of Systems     Objective:   Physical Exam  Cardiovascular:       EKG normal           Assessment & Plan:

## 2011-12-24 ENCOUNTER — Ambulatory Visit
Admission: RE | Admit: 2011-12-24 | Discharge: 2011-12-24 | Disposition: A | Discharge status: Home / Self Care | Payer: BC Managed Care – PPO | Source: Ambulatory Visit | Attending: Family Medicine | Admitting: Family Medicine

## 2011-12-24 DIAGNOSIS — N5089 Other specified disorders of the male genital organs: Secondary | ICD-10-CM

## 2011-12-28 NOTE — Progress Notes (Signed)
Quick Note:  I spoke with pt ______ 

## 2012-12-15 ENCOUNTER — Other Ambulatory Visit: Payer: Self-pay

## 2012-12-20 ENCOUNTER — Other Ambulatory Visit: Payer: Self-pay | Admitting: Family Medicine

## 2012-12-20 NOTE — Telephone Encounter (Signed)
Pt does have a CPE scheduled for December 2014 with Dr. Clent Ridges. I sent in scripts for a 90 day supply because pt uses a mail order.

## 2012-12-31 ENCOUNTER — Encounter (HOSPITAL_COMMUNITY): Payer: Self-pay | Admitting: Emergency Medicine

## 2012-12-31 ENCOUNTER — Emergency Department (HOSPITAL_COMMUNITY): Payer: BC Managed Care – PPO

## 2012-12-31 DIAGNOSIS — S91309A Unspecified open wound, unspecified foot, initial encounter: Secondary | ICD-10-CM | POA: Insufficient documentation

## 2012-12-31 DIAGNOSIS — E119 Type 2 diabetes mellitus without complications: Secondary | ICD-10-CM | POA: Insufficient documentation

## 2012-12-31 DIAGNOSIS — I10 Essential (primary) hypertension: Secondary | ICD-10-CM | POA: Insufficient documentation

## 2012-12-31 DIAGNOSIS — G8220 Paraplegia, unspecified: Secondary | ICD-10-CM | POA: Insufficient documentation

## 2012-12-31 DIAGNOSIS — W010XXA Fall on same level from slipping, tripping and stumbling without subsequent striking against object, initial encounter: Secondary | ICD-10-CM | POA: Insufficient documentation

## 2012-12-31 DIAGNOSIS — Z79899 Other long term (current) drug therapy: Secondary | ICD-10-CM | POA: Insufficient documentation

## 2012-12-31 DIAGNOSIS — Z87891 Personal history of nicotine dependence: Secondary | ICD-10-CM | POA: Insufficient documentation

## 2012-12-31 DIAGNOSIS — Z7982 Long term (current) use of aspirin: Secondary | ICD-10-CM | POA: Insufficient documentation

## 2012-12-31 DIAGNOSIS — Y9389 Activity, other specified: Secondary | ICD-10-CM | POA: Insufficient documentation

## 2012-12-31 DIAGNOSIS — E785 Hyperlipidemia, unspecified: Secondary | ICD-10-CM | POA: Insufficient documentation

## 2012-12-31 DIAGNOSIS — Y92009 Unspecified place in unspecified non-institutional (private) residence as the place of occurrence of the external cause: Secondary | ICD-10-CM | POA: Insufficient documentation

## 2012-12-31 NOTE — ED Notes (Signed)
Pt c/o LAC on bottom of foot. Bleeding controlled. Pt has hx of spinal cord injury. Does not have much feeling below hips.

## 2013-01-01 ENCOUNTER — Emergency Department (HOSPITAL_COMMUNITY)
Admission: EM | Admit: 2013-01-01 | Discharge: 2013-01-01 | Disposition: A | Discharge status: Home / Self Care | Payer: BC Managed Care – PPO | Attending: Emergency Medicine | Admitting: Emergency Medicine

## 2013-01-01 DIAGNOSIS — S91311A Laceration without foreign body, right foot, initial encounter: Secondary | ICD-10-CM

## 2013-01-01 MED ORDER — TETANUS-DIPHTH-ACELL PERTUSSIS 5-2.5-18.5 LF-MCG/0.5 IM SUSP
0.5000 mL | Freq: Once | INTRAMUSCULAR | Status: AC
  Administered 2013-01-01: 0.5 mL via INTRAMUSCULAR
  Filled 2013-01-01: qty 0.5

## 2013-01-01 NOTE — ED Notes (Signed)
Pt states he was at home in the bathroom and slid out of wheelchair some how making a laceration across the bottom of right foot around great toe. Pt states he is unable to feel pain due to being paraplegic. Pt family states right ankle more swollen than normal.

## 2013-01-01 NOTE — ED Notes (Signed)
PA at bedside.

## 2013-01-01 NOTE — ED Notes (Signed)
Discharge instructions reviewed. Unable to sign due to epic not allowing sign on.

## 2013-01-01 NOTE — ED Provider Notes (Signed)
CSN: [id number]     Arrival date & time 12/31/12  2107 History   First MD Initiated Contact with Patient 01/01/13 0127     Chief Complaint  Patient presents with  . Laceration   (Consider location/radiation/quality/duration/timing/severity/associated sxs/prior Treatment) HPI Comments: Patient is a 62 year old gentleman with a history of paraplegia secondary to spinal cord injury, wheelchair-bound, who presents for a laceration to the plantar aspect of his proximal right great toe. Patient states that the laceration was sustained this evening when he slipped in the bathroom. Patient denies any pain to the area as he states he has no sensation in his feet b/l which is his baseline. He denies any modifying factors the symptoms. He denies the use of blood thinners as well as any associated fever, pallor, erythema, and weakness. Denies any worsening bladder/bowel function since the fall.  Patient is a 62 y.o. male presenting with skin laceration. The history is provided by the patient. No language interpreter was used.  Laceration   Past Medical History  Diagnosis Date  . Allergic rhinitis   . Headache(784.0)   . History of nephrolithiasis   . Hypertension   . Paraplegia     wheelchair bound  . Diabetes mellitus   . Hyperlipidemia    Past Surgical History  Procedure Laterality Date  . Lysis of the spinal cord      at T12 coused from falling out of a tree in 1980  . Vasectomy    . Colonoscopy  05 17 10    per Dr. [place] Morelle polyp repeat in 10 years  . Hemorrhoid surgery      with biopsy of a benign anal ulcer June 2010 per Dr. Manus Rudd   Family History  Problem Relation Age of Onset  . Coronary artery disease    . Diabetes    . Hypertension    . Liver disease    . Stroke    . Thyroid disease     History  Substance Use Topics  . Smoking status: Former Smoker    Types: Cigarettes  . Smokeless tobacco: Never Used  . Alcohol Use: 3.5 oz/week    7 drink(s) per week     Review of Systems  Constitutional: Negative for fever.  Musculoskeletal: Negative for myalgias.  Skin: Positive for wound.  Neurological: Negative for weakness.  All other systems reviewed and are negative.    Allergies  Review of patient's allergies indicates no known allergies.  Home Medications   Current Outpatient Rx  Name  Route  Sig  Dispense  Refill  . amLODipine (NORVASC) 10 MG tablet      TAKE 1 TABLET BY MOUTH EVERY DAY   90 tablet   0   . aspirin 81 MG tablet   Oral   Take 1 tablet (81 mg total) by mouth daily.   90 tablet   3   . atorvastatin (LIPITOR) 20 MG tablet      TAKE 1 TABLET BY MOUTH EVERY DAY   90 tablet   0   . fish oil-omega-3 fatty acids 1000 MG capsule   Oral   Take 2 g by mouth 2 (two) times daily.           . hydrochlorothiazide (HYDRODIURIL) 25 MG tablet      TAKE 1 TABLET BY MOUTH EVERY DAY   90 tablet   0     Pt needs office visit for future refills   . lisinopril (PRINIVIL,ZESTRIL) 20 MG tablet  TAKE 1 TABLET BY MOUTH EVERY DAY   90 tablet   0   . metFORMIN (GLUCOPHAGE) 1000 MG tablet   Oral   Take 1 tablet (1,000 mg total) by mouth 2 (two) times daily with a meal.   180 tablet   3   . metoprolol (LOPRESSOR) 100 MG tablet      TAKE 1 TABLET BY MOUTH TWICE DAILY   180 tablet   0    BP 115/74  Pulse 73  Temp(Src) 98.2 F (36.8 C) (Oral)  Resp 18  SpO2 98%  Physical Exam  Nursing note and vitals reviewed. Constitutional: He is oriented to person, place, and time. He appears well-developed and well-nourished. No distress.  HENT:  Head: Normocephalic and atraumatic.  Eyes: Conjunctivae and EOM are normal. No scleral icterus.  Neck: Normal range of motion.  Cardiovascular: Normal rate, regular rhythm and intact distal pulses.   DP and PT pulses 2+ b/l. Capillary refill normal.  Pulmonary/Chest: Effort normal. No respiratory distress.  Musculoskeletal: Normal range of motion.  3.5cm laceration to  the plantar aspect of R foot at base of R great toe. Patient unable to wiggle all toes of R foot which is per his baseline. No active bleeding. No foreign body.  Neurological: He is alert and oriented to person, place, and time.  Chronic loss of sensation in b/l lower extremities.  Skin: Skin is warm and dry. No rash noted. He is not diaphoretic. No erythema. No pallor.  Psychiatric: He has a normal mood and affect. His behavior is normal.    ED Course  Procedures (including critical care time) Labs Review Labs Reviewed - No data to display Imaging Review Dg Foot 2 Views Right  12/31/2012   CLINICAL DATA:  Laceration of right foot.  EXAM: RIGHT FOOT - 2 VIEW  COMPARISON:  None.  FINDINGS: There is no evidence of fracture or dislocation. There is generalized edema of the soft tissues of the foot. There is diffuse osteopenia. There is no radiopaque foreign body.  IMPRESSION: No acute fracture or dislocation.   Electronically Signed   By: Sherian Rein M.D.   On: 12/31/2012 23:49    EKG Interpretation   None      LACERATION REPAIR Performed by: Antony Madura Authorized by: Antony Madura Consent: Verbal consent obtained. Risks and benefits: risks, benefits and alternatives were discussed Consent given by: patient Patient identity confirmed: provided demographic data Prepped and Draped in normal sterile fashion Wound explored  Laceration Location: Plantar aspect of R foot at base of R great toe  Laceration Length: 3.5cm  No Foreign Bodies seen or palpated  Anesthesia: none - patient without sensation secondary to spinal cord injury  Local anesthetic: none  Anesthetic total: n/a  Irrigation method: syringe Amount of cleaning: standard  Skin closure: 5-0 prolene  Number of sutures: 6  Technique: simple interrupted  Patient tolerance: Patient tolerated the procedure well with no immediate complications.  MDM   1. Foot laceration, right, initial encounter     Uncomplicated laceration to the plantar aspect of the right foot at the base of the right great toe. Patient with good perfusion in his right foot. Patient with loss of sensation in his bilateral lower extremities secondary to spinal cord injury causing paraplegia; wheelchair bound. Physical exam and x-ray without evidence of foreign body. Tetanus updated in ED. Laceration repaired with Prolene sutures which patient tolerated well. He is stable for discharge with primary care followup in 12-14 days for suture  removal. Have also advised PCP followup in the interim given his inability to sense pain or developing infection in his right foot. Return precautions discussed and patient agreeable to plan with no unaddressed concerns.    Antony Madura, [place] 01/02/13 [phone]

## 2013-01-03 NOTE — ED Provider Notes (Signed)
Medical screening examination/treatment/procedure(s) were performed by non-physician practitioner and as supervising physician I was immediately available for consultation/collaboration.  EKG Interpretation   None        Hildagard Sobecki, MD 01/03/13 1719 

## 2013-01-24 ENCOUNTER — Other Ambulatory Visit (INDEPENDENT_AMBULATORY_CARE_PROVIDER_SITE_OTHER): Payer: BC Managed Care – PPO

## 2013-01-24 DIAGNOSIS — Z0000 Encounter for general adult medical examination without abnormal findings: Secondary | ICD-10-CM

## 2013-01-24 LAB — POCT URINALYSIS DIPSTICK
Ketones, UA: NEGATIVE
Protein, UA: NEGATIVE
Spec Grav, UA: 1.01
pH, UA: 5.5

## 2013-01-24 LAB — BASIC METABOLIC PANEL
GFR: 112.41 mL/min (ref >60.00)
Glucose, Bld: 304 mg/dL — ABNORMAL HIGH (ref 70–99)
Potassium: 4.2 mEq/L (ref 3.5–5.1)
Sodium: 131 mEq/L — ABNORMAL LOW (ref 135–145)

## 2013-01-24 LAB — CBC WITH DIFFERENTIAL/PLATELET
Eosinophils Absolute: 0.2 10*3/uL (ref 0.0–0.7)
Eosinophils Relative: 3.3 % (ref 0.0–5.0)
MCV: 81.7 fl (ref 78.0–100.0)
Monocytes Absolute: 0.3 10*3/uL (ref 0.1–1.0)
Neutrophils Relative %: 64.8 % (ref 43.0–77.0)
Platelets: 254 10*3/uL (ref 150.0–400.0)
WBC: 5.8 10*3/uL (ref 4.5–10.5)

## 2013-01-24 LAB — HEPATIC FUNCTION PANEL
ALT: 24 U/L (ref 0–53)
Alkaline Phosphatase: 87 U/L (ref 39–117)
Bilirubin, Direct: 0.2 mg/dL (ref 0.0–0.3)
Total Protein: 7 g/dL (ref 6.0–8.3)

## 2013-01-24 LAB — LIPID PANEL
Cholesterol: 104 mg/dL (ref 0–200)
HDL: 28.2 mg/dL — ABNORMAL LOW (ref >39.00)
VLDL: 28.8 mg/dL (ref 0.0–40.0)

## 2013-01-27 ENCOUNTER — Encounter: Payer: Self-pay | Admitting: Family Medicine

## 2013-01-31 ENCOUNTER — Encounter: Payer: BC Managed Care – PPO | Admitting: Family Medicine

## 2013-02-01 ENCOUNTER — Ambulatory Visit (INDEPENDENT_AMBULATORY_CARE_PROVIDER_SITE_OTHER): Payer: BC Managed Care – PPO | Admitting: Family Medicine

## 2013-02-01 ENCOUNTER — Encounter: Payer: Self-pay | Admitting: Family Medicine

## 2013-02-01 VITALS — BP 118/70 | HR 64 | Temp 99.1°F

## 2013-02-01 DIAGNOSIS — N433 Hydrocele, unspecified: Secondary | ICD-10-CM

## 2013-02-01 DIAGNOSIS — Z0000 Encounter for general adult medical examination without abnormal findings: Secondary | ICD-10-CM

## 2013-02-01 DIAGNOSIS — Z23 Encounter for immunization: Secondary | ICD-10-CM

## 2013-02-01 MED ORDER — AMLODIPINE BESYLATE 10 MG PO TABS: ORAL_TABLET | ORAL | Status: DC

## 2013-02-01 MED ORDER — SAXAGLIPTIN HCL 5 MG PO TABS: 5.0000 mg | ORAL_TABLET | Freq: Every day | ORAL | Status: DC

## 2013-02-01 MED ORDER — GLIPIZIDE 10 MG PO TABS: 10.0000 mg | ORAL_TABLET | Freq: Two times a day (BID) | ORAL | Status: DC

## 2013-02-01 MED ORDER — HYDROCHLOROTHIAZIDE 25 MG PO TABS: ORAL_TABLET | ORAL | Status: DC

## 2013-02-01 MED ORDER — LISINOPRIL 20 MG PO TABS: ORAL_TABLET | ORAL | Status: DC

## 2013-02-01 MED ORDER — METOPROLOL TARTRATE 100 MG PO TABS: ORAL_TABLET | ORAL | Status: DC

## 2013-02-01 MED ORDER — METFORMIN HCL 1000 MG PO TABS: 1000.0000 mg | ORAL_TABLET | Freq: Two times a day (BID) | ORAL | Status: DC

## 2013-02-01 MED ORDER — ATORVASTATIN CALCIUM 20 MG PO TABS: ORAL_TABLET | ORAL | Status: DC

## 2013-02-01 NOTE — Progress Notes (Signed)
Pre visit review using our clinic review tool, if applicable. No additional management support is needed unless otherwise documented below in the visit note. 

## 2013-02-01 NOTE — Progress Notes (Signed)
   Subjective:    Patient ID: Dwayne Simpson, male    DOB: Nov 03, 1950, 62 y.o.   MRN: [id number]  HPI 62 yr old male for a cpx. He feels well in general but he has swelling in the scrotum that has begun to cause him discomfort. His labs reveal an A1c of 13.6 even though he takes Metformin regularly and his diet has not changed.   Review of Systems  Constitutional: Negative.   HENT: Negative.   Eyes: Negative.   Respiratory: Negative.   Cardiovascular: Negative.   Gastrointestinal: Negative.   Genitourinary: Positive for scrotal swelling and enuresis. Negative for dysuria, urgency, frequency, hematuria, flank pain, decreased urine volume, discharge, penile swelling, difficulty urinating, genital sores, penile pain and testicular pain.  Musculoskeletal: Negative.   Skin: Negative.   Neurological: Negative.   Psychiatric/Behavioral: Negative.        Objective:   Physical Exam  Constitutional: He is oriented to person, place, and time. He appears well-developed and well-nourished. No distress.  In his wheelchair, but he can stand for brief periods with support   HENT:  Head: Normocephalic and atraumatic.  Right Ear: External ear normal.  Left Ear: External ear normal.  Nose: Nose normal.  Mouth/Throat: Oropharynx is clear and moist. No oropharyngeal exudate.  Eyes: Conjunctivae and EOM are normal. Pupils are equal, round, and reactive to light. Right eye exhibits no discharge. Left eye exhibits no discharge. No scleral icterus.  Neck: Neck supple. No JVD present. No tracheal deviation present. No thyromegaly present.  Cardiovascular: Normal rate, regular rhythm, normal heart sounds and intact distal pulses.  Exam reveals no gallop and no friction rub.   No murmur heard. EKG normal  Pulmonary/Chest: Effort normal and breath sounds normal. No respiratory distress. He has no wheezes. He has no rales. He exhibits no tenderness.  Abdominal: Soft. Bowel sounds are normal. He exhibits no  distension and no mass. There is no tenderness. There is no rebound and no guarding.  Genitourinary: Rectum normal, prostate normal and penis normal. Guaiac negative stool. No penile tenderness.  Has a large non-tender hydrocele on the right scrotum   Musculoskeletal: Normal range of motion. He exhibits no edema and no tenderness.  Lymphadenopathy:    He has no cervical adenopathy.  Neurological: He is alert and oriented to person, place, and time. He has normal reflexes. No cranial nerve deficit. He exhibits normal muscle tone. Coordination normal.  Skin: Skin is warm and dry. No rash noted. He is not diaphoretic. No erythema. No pallor.  Psychiatric: He has a normal mood and affect. His behavior is normal. Judgment and thought content normal.          Assessment & Plan:  Well exam. We will add Glipizide and Onglyza to his meds. Wrote for him to get his own glucometer so he can check his glucoses daily at home. Recheck here in one month. Refer to Urology for the hydrocele.

## 2013-02-07 ENCOUNTER — Telehealth: Payer: Self-pay | Admitting: Family Medicine

## 2013-02-07 MED ORDER — GLUCOSE BLOOD VI STRP: ORAL_STRIP | Status: DC

## 2013-02-07 NOTE — Telephone Encounter (Signed)
I spoke with pt and he needs strips for a Accu-check aviva plus machine and I did send script e-scribe to [facility].

## 2013-02-07 NOTE — Telephone Encounter (Signed)
I left voice message for pt to return my call. We received a fax that denied the Aviva Plus Test Strips. The fax does list other options. I need to know if pt has already purchased this type of machine? If so then what kind and then we need to order the correct strips for pt.

## 2013-03-06 ENCOUNTER — Encounter: Payer: Self-pay | Admitting: Family Medicine

## 2013-03-06 ENCOUNTER — Ambulatory Visit (INDEPENDENT_AMBULATORY_CARE_PROVIDER_SITE_OTHER): Payer: BC Managed Care – PPO | Admitting: Family Medicine

## 2013-03-06 VITALS — BP 118/72 | HR 72 | Temp 99.5°F | Ht 72.0 in

## 2013-03-06 DIAGNOSIS — I10 Essential (primary) hypertension: Secondary | ICD-10-CM

## 2013-03-06 DIAGNOSIS — E119 Type 2 diabetes mellitus without complications: Secondary | ICD-10-CM

## 2013-03-06 MED ORDER — SAXAGLIPTIN HCL 5 MG PO TABS: 5.0000 mg | ORAL_TABLET | Freq: Every day | ORAL | Status: DC

## 2013-03-06 NOTE — Progress Notes (Signed)
Pre visit review using our clinic review tool, if applicable. No additional management support is needed unless otherwise documented below in the visit note. 

## 2013-03-06 NOTE — Progress Notes (Signed)
   Subjective:    Patient ID: Dwayne Simpson, male    DOB: Jan 11, 1951, 63 y.o.   MRN: [id number]  HPI Here to follow up diabetes after a cpx last month. We added Glipizide and Onglyza to his Metformin. He has done well and he feels good. He has started working out on a [employer] he has in his garage. His glucoses have come down to getting fasting am values of 83 to 144 and getting pm values from 128 to 152.    Review of Systems  Constitutional: Negative.   Respiratory: Negative.   Cardiovascular: Negative.        Objective:   Physical Exam  Constitutional: He appears well-developed and well-nourished.  Cardiovascular: Normal rate, regular rhythm, normal heart sounds and intact distal pulses.   Pulmonary/Chest: Effort normal and breath sounds normal.          Assessment & Plan:  He is doing much better with glycemic control. We will stay on the current regimen and recheck with an A1c in 2 months

## 2013-03-07 ENCOUNTER — Telehealth: Payer: Self-pay | Admitting: Family Medicine

## 2013-03-07 NOTE — Telephone Encounter (Signed)
Relevant patient education assigned to patient using Emmi. ° °

## 2013-03-08 ENCOUNTER — Telehealth: Payer: Self-pay

## 2013-03-08 NOTE — Telephone Encounter (Signed)
Relevant patient education assigned to patient using Emmi. ° °

## 2013-03-14 ENCOUNTER — Other Ambulatory Visit: Payer: Self-pay | Admitting: Urology

## 2013-03-20 ENCOUNTER — Encounter (HOSPITAL_BASED_OUTPATIENT_CLINIC_OR_DEPARTMENT_OTHER): Payer: Self-pay | Admitting: *Deleted

## 2013-03-22 ENCOUNTER — Encounter (HOSPITAL_BASED_OUTPATIENT_CLINIC_OR_DEPARTMENT_OTHER): Payer: Self-pay | Admitting: *Deleted

## 2013-03-22 NOTE — Progress Notes (Signed)
NPO AFTER MN. ARRIVE AT 0800. NEEDS ISTAT . CURRENT EKG IN CHART AND EPIC. WILL TAKE NORVASC AND METOPROLOL AM DOS W/ SIPS OF WATER. PT IS PARAPLEGIC FROM PELVIC DOWN, ABLE TO STAND W/ ASSIST NOT WALK, AND TRANSFER SELF.

## 2013-03-29 ENCOUNTER — Ambulatory Visit (HOSPITAL_BASED_OUTPATIENT_CLINIC_OR_DEPARTMENT_OTHER): Payer: BC Managed Care – PPO | Admitting: Anesthesiology

## 2013-03-29 ENCOUNTER — Encounter (HOSPITAL_BASED_OUTPATIENT_CLINIC_OR_DEPARTMENT_OTHER): Payer: Self-pay | Admitting: Anesthesiology

## 2013-03-29 ENCOUNTER — Encounter (HOSPITAL_BASED_OUTPATIENT_CLINIC_OR_DEPARTMENT_OTHER)
Admission: RE | Disposition: A | Discharge status: Home / Self Care | Payer: Self-pay | Source: Ambulatory Visit | Attending: Urology

## 2013-03-29 ENCOUNTER — Ambulatory Visit (HOSPITAL_BASED_OUTPATIENT_CLINIC_OR_DEPARTMENT_OTHER)
Admission: RE | Admit: 2013-03-29 | Discharge: 2013-03-29 | Disposition: A | Discharge status: Home / Self Care | Payer: BC Managed Care – PPO | Source: Ambulatory Visit | Attending: Urology | Admitting: Urology

## 2013-03-29 ENCOUNTER — Encounter (HOSPITAL_BASED_OUTPATIENT_CLINIC_OR_DEPARTMENT_OTHER): Payer: BC Managed Care – PPO | Admitting: Anesthesiology

## 2013-03-29 DIAGNOSIS — N433 Hydrocele, unspecified: Secondary | ICD-10-CM | POA: Insufficient documentation

## 2013-03-29 DIAGNOSIS — E7800 Pure hypercholesterolemia, unspecified: Secondary | ICD-10-CM | POA: Insufficient documentation

## 2013-03-29 DIAGNOSIS — E119 Type 2 diabetes mellitus without complications: Secondary | ICD-10-CM | POA: Insufficient documentation

## 2013-03-29 DIAGNOSIS — Z79899 Other long term (current) drug therapy: Secondary | ICD-10-CM | POA: Insufficient documentation

## 2013-03-29 DIAGNOSIS — Z7982 Long term (current) use of aspirin: Secondary | ICD-10-CM | POA: Insufficient documentation

## 2013-03-29 DIAGNOSIS — I10 Essential (primary) hypertension: Secondary | ICD-10-CM | POA: Insufficient documentation

## 2013-03-29 DIAGNOSIS — Z9852 Vasectomy status: Secondary | ICD-10-CM | POA: Insufficient documentation

## 2013-03-29 HISTORY — DX: Type 2 diabetes mellitus without complications: E11.9

## 2013-03-29 HISTORY — DX: Unspecified injury at t11-T12 level of thoracic spinal cord, initial encounter: S24.104A

## 2013-03-29 HISTORY — DX: Hydrocele, unspecified: N43.3

## 2013-03-29 HISTORY — DX: Presence of spectacles and contact lenses: Z97.3

## 2013-03-29 HISTORY — DX: Dependence on wheelchair: Z99.3

## 2013-03-29 HISTORY — DX: Unspecified osteoarthritis, unspecified site: M19.90

## 2013-03-29 HISTORY — PX: HYDROCELE EXCISION: SHX482

## 2013-03-29 LAB — POCT I-STAT 4, (NA,K, GLUC, HGB,HCT)
Glucose, Bld: 125 mg/dL — ABNORMAL HIGH (ref 70–99)
HEMATOCRIT: 35 % — AB (ref 39.0–52.0)
Hemoglobin: 11.9 g/dL — ABNORMAL LOW (ref 13.0–17.0)
Potassium: 4 mEq/L (ref 3.7–5.3)
SODIUM: 141 meq/L (ref 137–147)

## 2013-03-29 LAB — GLUCOSE, CAPILLARY: Glucose-Capillary: 106 mg/dL — ABNORMAL HIGH (ref 70–99)

## 2013-03-29 SURGERY — HYDROCELECTOMY
Anesthesia: General | Site: Scrotum | Laterality: Right

## 2013-03-29 MED ORDER — FENTANYL CITRATE 0.05 MG/ML IJ SOLN
INTRAMUSCULAR | Status: DC | PRN
  Administered 2013-03-29 (×2): 50 ug via INTRAVENOUS

## 2013-03-29 MED ORDER — LACTATED RINGERS IV SOLN
INTRAVENOUS | Status: DC
  Administered 2013-03-29: 09:00:00 via INTRAVENOUS
  Filled 2013-03-29: qty 1000

## 2013-03-29 MED ORDER — KETOROLAC TROMETHAMINE 30 MG/ML IJ SOLN
INTRAMUSCULAR | Status: DC | PRN
  Administered 2013-03-29: 30 mg via INTRAVENOUS

## 2013-03-29 MED ORDER — FENTANYL CITRATE 0.05 MG/ML IJ SOLN
25.0000 ug | INTRAMUSCULAR | Status: DC | PRN
  Administered 2013-03-29: 25 ug via INTRAVENOUS
  Filled 2013-03-29: qty 1

## 2013-03-29 MED ORDER — ACETAMINOPHEN-CODEINE #3 300-30 MG PO TABS: 1.0000 | ORAL_TABLET | ORAL | Status: DC | PRN

## 2013-03-29 MED ORDER — BUPIVACAINE HCL (PF) 0.25 % IJ SOLN
INTRAMUSCULAR | Status: DC | PRN
  Administered 2013-03-29: 10 mL

## 2013-03-29 MED ORDER — LIDOCAINE HCL (CARDIAC) 20 MG/ML IV SOLN
INTRAVENOUS | Status: DC | PRN
  Administered 2013-03-29: 80 mg via INTRAVENOUS

## 2013-03-29 MED ORDER — LACTATED RINGERS IV SOLN
INTRAVENOUS | Status: DC
  Filled 2013-03-29: qty 1000

## 2013-03-29 MED ORDER — EPHEDRINE SULFATE 50 MG/ML IJ SOLN
INTRAMUSCULAR | Status: DC | PRN
  Administered 2013-03-29: 10 mg via INTRAVENOUS

## 2013-03-29 MED ORDER — ACETAMINOPHEN 10 MG/ML IV SOLN
INTRAVENOUS | Status: DC | PRN
  Administered 2013-03-29: 1000 mg via INTRAVENOUS

## 2013-03-29 MED ORDER — MEPERIDINE HCL 25 MG/ML IJ SOLN
6.2500 mg | INTRAMUSCULAR | Status: DC | PRN
  Filled 2013-03-29: qty 1

## 2013-03-29 MED ORDER — MIDAZOLAM HCL 5 MG/5ML IJ SOLN
INTRAMUSCULAR | Status: DC | PRN
  Administered 2013-03-29: 2 mg via INTRAVENOUS

## 2013-03-29 MED ORDER — DOCUSATE SODIUM 100 MG PO CAPS: 100.0000 mg | ORAL_CAPSULE | Freq: Two times a day (BID) | ORAL | Status: DC | PRN

## 2013-03-29 MED ORDER — CEFAZOLIN SODIUM 1-5 GM-% IV SOLN
1.0000 g | INTRAVENOUS | Status: DC
  Filled 2013-03-29: qty 50

## 2013-03-29 MED ORDER — FENTANYL CITRATE 0.05 MG/ML IJ SOLN
INTRAMUSCULAR | Status: AC
  Filled 2013-03-29: qty 6

## 2013-03-29 MED ORDER — CEFAZOLIN SODIUM-DEXTROSE 2-3 GM-% IV SOLR
2.0000 g | INTRAVENOUS | Status: AC
  Administered 2013-03-29: 2 g via INTRAVENOUS
  Filled 2013-03-29: qty 50

## 2013-03-29 MED ORDER — PROMETHAZINE HCL 25 MG/ML IJ SOLN
6.2500 mg | INTRAMUSCULAR | Status: DC | PRN
  Filled 2013-03-29: qty 1

## 2013-03-29 MED ORDER — FENTANYL CITRATE 0.05 MG/ML IJ SOLN
INTRAMUSCULAR | Status: AC
  Filled 2013-03-29: qty 2

## 2013-03-29 MED ORDER — PROPOFOL 10 MG/ML IV BOLUS
INTRAVENOUS | Status: DC | PRN
  Administered 2013-03-29: 200 mg via INTRAVENOUS
  Administered 2013-03-29: 50 mg via INTRAVENOUS

## 2013-03-29 MED ORDER — ASPIRIN 81 MG PO TABS: 81.0000 mg | ORAL_TABLET | Freq: Every day | ORAL | Status: AC

## 2013-03-29 MED ORDER — MIDAZOLAM HCL 2 MG/2ML IJ SOLN
INTRAMUSCULAR | Status: AC
  Filled 2013-03-29: qty 2

## 2013-03-29 MED ORDER — ONDANSETRON HCL 4 MG/2ML IJ SOLN
INTRAMUSCULAR | Status: DC | PRN
  Administered 2013-03-29: 4 mg via INTRAVENOUS

## 2013-03-29 MED ORDER — TROSPIUM CHLORIDE ER 60 MG PO CP24: 60.0000 mg | ORAL_CAPSULE | Freq: Every day | ORAL | Status: DC

## 2013-03-29 MED ORDER — SODIUM CHLORIDE 0.9 % IR SOLN
Status: DC | PRN
  Administered 2013-03-29: 500 mL

## 2013-03-29 SURGICAL SUPPLY — 46 items
BLADE SURG 15 STRL LF DISP TIS (BLADE) ×1 IMPLANT
BLADE SURG 15 STRL SS (BLADE) ×1
BLADE SURG ROTATE 9660 (MISCELLANEOUS) ×2 IMPLANT
BNDG GAUZE ELAST 4 BULKY (GAUZE/BANDAGES/DRESSINGS) ×2 IMPLANT
BRIEF STRETCH FOR OB PAD LRG (UNDERPADS AND DIAPERS) IMPLANT
CANISTER SUCTION 1200CC (MISCELLANEOUS) IMPLANT
CANISTER SUCTION 2500CC (MISCELLANEOUS) ×2 IMPLANT
CLEANER CAUTERY TIP 5X5 PAD (MISCELLANEOUS) ×1 IMPLANT
CLOTH BEACON ORANGE TIMEOUT ST (SAFETY) ×2 IMPLANT
COVER MAYO STAND STRL (DRAPES) ×2 IMPLANT
COVER TABLE BACK 60X90 (DRAPES) ×2 IMPLANT
DERMABOND ADVANCED (GAUZE/BANDAGES/DRESSINGS) ×1
DERMABOND ADVANCED .7 DNX12 (GAUZE/BANDAGES/DRESSINGS) ×1 IMPLANT
DISSECTOR ROUND CHERRY 3/8 STR (MISCELLANEOUS) IMPLANT
DRAIN PENROSE 18X1/4 LTX STRL (WOUND CARE) IMPLANT
DRAPE PED LAPAROTOMY (DRAPES) ×2 IMPLANT
ELECT NEEDLE BLADE 2-5/6 (NEEDLE) ×2 IMPLANT
ELECT NEEDLE TIP 2.8 STRL (NEEDLE) IMPLANT
ELECT REM PT RETURN 9FT ADLT (ELECTROSURGICAL) ×2
ELECTRODE REM PT RTRN 9FT ADLT (ELECTROSURGICAL) ×1 IMPLANT
GAUZE SPONGE 4X4 16PLY XRAY LF (GAUZE/BANDAGES/DRESSINGS) IMPLANT
GLOVE BIO SURGEON STRL SZ7 (GLOVE) ×2 IMPLANT
GLOVE BIO SURGEON STRL SZ7.5 (GLOVE) ×2 IMPLANT
GLOVE INDICATOR 7.5 STRL GRN (GLOVE) ×2 IMPLANT
GOWN PREVENTION PLUS LG XLONG (DISPOSABLE) IMPLANT
GOWN STRL REIN XL XLG (GOWN DISPOSABLE) IMPLANT
GOWN STRL REUS W/ TWL XL LVL3 (GOWN DISPOSABLE) ×2 IMPLANT
GOWN STRL REUS W/TWL XL LVL3 (GOWN DISPOSABLE) ×2
NEEDLE HYPO 22GX1.5 SAFETY (NEEDLE) IMPLANT
NS IRRIG 500ML POUR BTL (IV SOLUTION) ×2 IMPLANT
PACK BASIN DAY SURGERY FS (CUSTOM PROCEDURE TRAY) ×2 IMPLANT
PAD CLEANER CAUTERY TIP 5X5 (MISCELLANEOUS) ×1
PENCIL BUTTON HOLSTER BLD 10FT (ELECTRODE) ×2 IMPLANT
SPONGE GAUZE 4X4 12PLY (GAUZE/BANDAGES/DRESSINGS) ×2 IMPLANT
SUT MNCRL AB 4-0 PS2 18 (SUTURE) ×2 IMPLANT
SUT VIC AB 3-0 SH 27 (SUTURE) ×1
SUT VIC AB 3-0 SH 27X BRD (SUTURE) ×1 IMPLANT
SUT VICRYL 3 0 BR 18  UND (SUTURE)
SUT VICRYL 3 0 BR 18 UND (SUTURE) IMPLANT
SYR BULB IRRIGATION 50ML (SYRINGE) ×2 IMPLANT
SYR CONTROL 10ML LL (SYRINGE) ×2 IMPLANT
TOWEL OR 17X24 6PK STRL BLUE (TOWEL DISPOSABLE) ×4 IMPLANT
TRAY DSU PREP LF (CUSTOM PROCEDURE TRAY) ×2 IMPLANT
TUBE CONNECTING 12X1/4 (SUCTIONS) ×2 IMPLANT
WATER STERILE IRR 500ML POUR (IV SOLUTION) IMPLANT
YANKAUER SUCT BULB TIP NO VENT (SUCTIONS) ×2 IMPLANT

## 2013-03-29 NOTE — Op Note (Signed)
Preoperative diagnosis:  1. right hydrocele  Postoperative diagnosis:  1. right hydrocele  Procedure:       right hydrocelectomy  Surgeon: Ardis Hughs, MD  Anesthesia: General  Complications: None  Intraoperative findings: 220cc of strawcolored fluid drained from hydrocele sac.  EBL: Minimal  Specimens: None  Indication: Indication: Dwayne Simpson is a 63 y.o. patient with ultrasound confirmed left hydrocele.  After reviewing the management options for treatment, he elected to proceed with the above surgical procedure(s). We have discussed the potential benefits and risks of the procedure, side effects of the proposed treatment, the likelihood of the patient achieving the goals of the procedure, and any potential problems that might occur during the procedure or recuperation. Informed consent has been obtained.  Description of procedure:  The patient was taken to the operating room and general anesthesia was induced. The patient was placed on the table in supine position, general anesthesia was then induced and an LMA inserted. The scrotum was then prepped and draped in the routine sterile fashion. A timeout was then held with confirmation of antibiotics.  I then made a midline incision through the scrotal median raphae through the skin and into the dartos. Once through several layers the dartos was able to get the right testicle and contents out of the right  hemiscrotum and into the surgical field.   The hydrocele sac was then dissected out removing the overlying layers of the dartos tunica.  The sac was then opened with Metzenbaum scissors and the fluid drained from the sac.  The opening was then continued so that the entire sac was bivalved.  The edges were then removed, leaving a small edge of tissue around the testicle.  The edge of the remaining sac was cauterized.    Meticulous hemostasis was then achieved. The right hemiscrotum was then copiously irrigated and a final  check for hemostasis performed. I then closed the dartos with a 3-0 Vicryl in a running/locking stitch. I closed the skin with a 4-0 Monocryl in a vertical mattress running fashion. I then injected 10 cc of quarter percent Marcaine into the incision, and then placed Dermabond over the incision. A fluff dressing and mesh underpants were then applied area.  The patient tolerated the procedure without any perioperative complications. At the end of the case all last needles and sponges had been accounted for. The patient was returned to the PACU in excellent condition.   Ardis Hughs, M.D.

## 2013-03-29 NOTE — Anesthesia Procedure Notes (Signed)
Procedure Name: LMA Insertion Date/Time: 03/29/2013 9:02 AM Performed by: Bethena Roys T Pre-anesthesia Checklist: Patient identified, Emergency Drugs available, Suction available and Patient being monitored Patient Re-evaluated:Patient Re-evaluated prior to inductionOxygen Delivery Method: Circle System Utilized Preoxygenation: Pre-oxygenation with 100% oxygen Intubation Type: IV induction Ventilation: Mask ventilation without difficulty LMA: LMA inserted LMA Size: 5.0 Number of attempts: 1 Airway Equipment and Method: bite block Placement Confirmation: positive ETCO2 Dental Injury: Teeth and Oropharynx as per pre-operative assessment

## 2013-03-29 NOTE — H&P (Signed)
Reason For Visit Right hydrocele   History of Present Illness This is a 63 year old male who was referred by Dr. Delma Freeze, M.D. for evaluation and management of a right hydrocele.  The patient has had a right hemi-scrotal bulge for some time. It has progressed. It is causing significant annoyance, and is bothersome to the patient. He denies any pain associated with it. He is not sexually active, and as such it is not affecting intercourse. He denies any voiding symptoms, hematuria, dysuria.   Past Medical History Problems  1. History of diabetes mellitus (V12.29) 2. History of hypercholesterolemia (V12.29) 3. History of hypertension (V12.59) 4. History of spinal cord injury (V12.49)  Surgical History Problems  1. History of Hemorrhoidectomy 2. History of Spine Repair 3. History of Surgery Of Male Genitalia Vasectomy  Current Meds 1. AmLODIPine Besylate 10 MG Oral Tablet;  Therapy: (Recorded:26Jan2015) to Recorded 2. Aspirin 81 MG Oral Tablet;  Therapy: (Recorded:26Jan2015) to Recorded 3. GlipiZIDE 10 MG Oral Tablet;  Therapy: (CVELFYBO:17PZW2585) to Recorded 4. Hydrochlorothiazide 25 MG Oral Tablet;  Therapy: (IDPOEUMP:53IRW4315) to Recorded 5. Lipitor 20 MG Oral Tablet (Atorvastatin Calcium);  Therapy: (QMGQQPYP:95KDT2671) to Recorded 6. Lisinopril 20 MG Oral Tablet;  Therapy: (IWPYKDXI:33ASN0539) to Recorded 7. MetFORMIN HCl - 1000 MG Oral Tablet;  Therapy: (JQBHALPF:79KWI0973) to Recorded 8. Metoprolol Tartrate 100 MG Oral Tablet;  Therapy: (ZHGDJMEQ:68TMH9622) to Recorded 9. Onglyza 5 MG Oral Tablet;  Therapy: (Recorded:26Jan2015) to Recorded  Allergies Medication  1. No Known Drug Allergies  Family History Problems  1. Family history of cerebrovascular accident (V17.1) : Mother 2. Family history of liver cancer (V16.0) : Father  Social History Problems    Alcohol use   Caffeine use (V49.89)   Death in the family, father   age 49 liver cancer   Death  in the family, mother   age 74 stroke   Married   Occupation   Editor, commissioning   Three children   Tobacco smoking status unknown  Review of Systems Genitourinary, constitutional, skin, eye, otolaryngeal, hematologic/lymphatic, cardiovascular, pulmonary, endocrine, musculoskeletal, gastrointestinal, neurological and psychiatric system(s) were reviewed and pertinent findings if present are noted.  Genitourinary: feelings of urinary urgency, nocturia, incontinence, difficulty starting the urinary stream and initiating urination requires straining.  Cardiovascular: leg swelling.    Vitals Vital Signs [Data Includes: Last 1 Day]  Recorded: 26Jan2015 01:53PM  Height: 6 ft  Weight: 220 lb  BMI Calculated: 29.84 BSA Calculated: 2.22 Blood Pressure: 131 / 74 Temperature: 98.5 F Heart Rate: 73  Physical Exam Constitutional: Well nourished and well developed . No acute distress.  ENT:. The ears and nose are normal in appearance.  Neck: The appearance of the neck is normal and no neck mass is present.  Pulmonary: No respiratory distress and normal respiratory rhythm and effort.  Cardiovascular: Heart rate and rhythm are normal . The arterial pulses are normal. No peripheral edema.  Abdomen: The abdomen is soft and nontender. No hernias are palpable. No hepatosplenomegaly noted.  Genitourinary: Examination of the penis demonstrates a normal meatus. The penis is circumcised. Examination of the right scrotum demonstrates a hydrocele. Examination of the left scrotum demostrates no hydrocele. The right testis is nonpalpable. The left testis is normal.  Lymphatics: The femoral and inguinal nodes are not enlarged or tender.  Skin: Normal skin turgor, no visible rash and no visible skin lesions.  Neuro/Psych:. Mood and affect are appropriate.    Results/Data Urine [Data Includes: Last 1 Day]   [id number]  COLOR YELLOW   APPEARANCE  CLEAR   SPECIFIC GRAVITY 1.010   pH 6.5   GLUCOSE NEG  mg/dL  BILIRUBIN NEG   KETONE NEG mg/dL  BLOOD TRACE   PROTEIN NEG mg/dL  UROBILINOGEN 0.2 mg/dL  NITRITE NEG   LEUKOCYTE ESTERASE NEG   SQUAMOUS EPITHELIAL/HPF RARE   WBC 0-2 WBC/hpf  RBC 3-6 RBC/hpf  BACTERIA NONE SEEN   CRYSTALS NONE SEEN   CASTS NONE SEEN    The following images/tracing/specimen were independently visualized: Marland Kitchen   Scrotal ultrasound 12/2012: This shows a moderate right-sided hydrocele. The epididymis appears normal on the right side and does the right testicle. The left hemi-scrotum is also completely normal.    Assessment Assessed  1. Hydrocele of testis (603.9)  Plan Health Maintenance  1. UA With REFLEX; [Do Not Release]; Status:Complete;   Done: [id number] 01:36PM Hydrocele of testis  2. Follow-up Schedule Surgery Office  Follow-up  Status: Hold For - Appointment   Requested for: [id number]  Discussion/Summary The treatment options for this patient's right hydrocele. We discussed surgical excision. Whenever the surgery in detail with the patient. I explained to him that there was a small chance of recurrence after surgical excision. I also explained him the risk of hematoma, infection, and testicular atrophy or need for orchiectomy. I reiterated the importance of tight blood glucose control and the healing process of surgical incisions. Given the risks of the surgery, the patient has elected to proceed. We will get this scheduled at the patient's earliest convenience.

## 2013-03-29 NOTE — Discharge Instructions (Signed)
°  Post Anesthesia Home Care Instructions ° °Activity: °Get plenty of rest for the remainder of the day. A responsible adult should stay with you for 24 hours following the procedure.  °For the next 24 hours, DO NOT: °-Drive a car °-Operate machinery °-Drink alcoholic beverages °-Take any medication unless instructed by your physician °-Make any legal decisions or sign important papers. ° °Meals: °Start with liquid foods such as gelatin or soup. Progress to regular foods as tolerated. Avoid greasy, spicy, heavy foods. If nausea and/or vomiting occur, drink only clear liquids until the nausea and/or vomiting subsides. Call your physician if vomiting continues. ° °Special Instructions/Symptoms: °Your throat may feel dry or sore from the anesthesia or the breathing tube placed in your throat during surgery. If this causes discomfort, gargle with warm salt water. The discomfort should disappear within 24 hours. ° ° ° HOME CARE INSTRUCTIONS FOR SCROTAL PROCEDURES ° °Wound Care & Hygiene: °You may apply an ice bag to the scrotum for the first 24 hours.  This may help decrease swelling and soreness.  You may have a dressing held in place by an athletic supporter.  You may remove the dressing in 24 hours and shower in 48 hours.  Continue to use the athletic supporter or tight briefs for at least a week. °Activity: °Rest today - not necessarily flat bed rest.  Just take it easy.  You should not do strenuous activities until your follow-up visit with your doctor.  You may resume light activity in 48 hours. ° °Return to Work: ° °Your doctor will advise you of this depending on the type of work you do ° °Diet: °Drink liquids or eat a light diet this evening.  You may resume a regular diet tomorrow. ° °General Expectations: °You may have a small amount of bleeding.  The scrotum may be swollen or bruised for about a week. ° °Call your Doctor if these occur: ° -persistent or heavy bleeding ° -temperature of 101 degrees or  more ° -severe pain, not relieved by your pain medication ° °Return to Doctor's Office:  °Call to set up and appointment. ° °Patient Signature:  __________________________________________________ ° °Nurse's Signature:  __________________________________________________ ° °

## 2013-03-29 NOTE — Transfer of Care (Signed)
Immediate Anesthesia Transfer of Care Note  Patient: Dwayne Simpson  Procedure(s) Performed: Procedure(s): RIGHT HYDROCELECTOMY ADULT (Right)  Patient Location: PACU  Anesthesia Type:General  Level of Consciousness: awake and oriented  Airway & Oxygen Therapy: Patient Spontanous Breathing and Patient connected to nasal cannula oxygen  Post-op Assessment: Report given to PACU RN  Post vital signs: Reviewed and stable  Complications: No apparent anesthesia complications

## 2013-03-29 NOTE — Anesthesia Preprocedure Evaluation (Addendum)
Anesthesia Evaluation  Patient identified by MRN, date of birth, ID band Patient awake    Reviewed: Allergy & Precautions, H&P , NPO status , Patient's Chart, lab work & pertinent test results  Airway Mallampati: II TM Distance: >3 FB Neck ROM: Full    Dental no notable dental hx.    Pulmonary neg pulmonary ROS, former smoker,  breath sounds clear to auscultation  Pulmonary exam normal       Cardiovascular hypertension, Pt. on medications Rhythm:Regular Rate:Normal     Neuro/Psych T12 paraplegic  negative psych ROS   GI/Hepatic negative GI ROS, Neg liver ROS,   Endo/Other  diabetes, Type 2, Oral Hypoglycemic Agents  Renal/GU negative Renal ROS  negative genitourinary   Musculoskeletal negative musculoskeletal ROS (+)   Abdominal   Peds negative pediatric ROS (+)  Hematology negative hematology ROS (+)   Anesthesia Other Findings   Reproductive/Obstetrics negative OB ROS                         Anesthesia Physical Anesthesia Plan  ASA: II  Anesthesia Plan: General   Post-op Pain Management:    Induction: Intravenous  Airway Management Planned: LMA  Additional Equipment:   Intra-op Plan:   Post-operative Plan:   Informed Consent: I have reviewed the patients History and Physical, chart, labs and discussed the procedure including the risks, benefits and alternatives for the proposed anesthesia with the patient or authorized representative who has indicated his/her understanding and acceptance.   Dental advisory given  Plan Discussed with: CRNA  Anesthesia Plan Comments:         Anesthesia Quick Evaluation

## 2013-03-30 ENCOUNTER — Encounter (HOSPITAL_BASED_OUTPATIENT_CLINIC_OR_DEPARTMENT_OTHER): Payer: Self-pay | Admitting: Urology

## 2013-03-30 NOTE — Anesthesia Postprocedure Evaluation (Signed)
  Anesthesia Post-op Note  Patient: Dwayne Simpson  Procedure(s) Performed: Procedure(s) (LRB): RIGHT HYDROCELECTOMY ADULT (Right)  Patient Location: PACU  Anesthesia Type: General  Level of Consciousness: awake and alert   Airway and Oxygen Therapy: Patient Spontanous Breathing  Post-op Pain: mild  Post-op Assessment: Post-op Vital signs reviewed, Patient's Cardiovascular Status Stable, Respiratory Function Stable, Patent Airway and No signs of Nausea or vomiting  Last Vitals:  Filed Vitals:   03/29/13 1145  BP: 111/69  Pulse: 69  Temp: 36.1 C  Resp: 18    Post-op Vital Signs: stable   Complications: No apparent anesthesia complications

## 2013-04-17 ENCOUNTER — Encounter (HOSPITAL_BASED_OUTPATIENT_CLINIC_OR_DEPARTMENT_OTHER): Payer: Self-pay | Admitting: Urology

## 2013-04-17 ENCOUNTER — Other Ambulatory Visit: Payer: Self-pay | Admitting: Family Medicine

## 2013-09-05 ENCOUNTER — Telehealth: Payer: Self-pay | Admitting: *Deleted

## 2013-09-05 DIAGNOSIS — E785 Hyperlipidemia, unspecified: Secondary | ICD-10-CM

## 2013-09-05 DIAGNOSIS — E119 Type 2 diabetes mellitus without complications: Secondary | ICD-10-CM

## 2013-09-05 NOTE — Telephone Encounter (Signed)
Left message on machine and my chart for patient to schedule an appointment to follow up with diabetes. A1c, bmet , micro albumin ordered Diabetic bundle

## 2013-09-06 ENCOUNTER — Other Ambulatory Visit (INDEPENDENT_AMBULATORY_CARE_PROVIDER_SITE_OTHER): Payer: BC Managed Care – PPO

## 2013-09-06 DIAGNOSIS — E119 Type 2 diabetes mellitus without complications: Secondary | ICD-10-CM

## 2013-09-06 LAB — BASIC METABOLIC PANEL
BUN: 20 mg/dL (ref 6–23)
CO2: 26 mEq/L (ref 19–32)
Calcium: 9.5 mg/dL (ref 8.4–10.5)
Chloride: 102 mEq/L (ref 96–112)
Creatinine, Ser: 0.7 mg/dL (ref 0.4–1.5)
GFR: 115.74 mL/min (ref >60.00)
Glucose, Bld: 83 mg/dL (ref 70–99)
Potassium: 3.9 mEq/L (ref 3.5–5.1)
SODIUM: 136 meq/L (ref 135–145)

## 2013-09-06 LAB — HEMOGLOBIN A1C: HEMOGLOBIN A1C: 6.8 % — AB (ref 4.6–6.5)

## 2013-09-06 LAB — MICROALBUMIN / CREATININE URINE RATIO
CREATININE, U: 64.2 mg/dL
Microalb Creat Ratio: 0.8 mg/g (ref 0.0–30.0)
Microalb, Ur: 0.5 mg/dL (ref 0.0–1.9)

## 2014-02-14 ENCOUNTER — Other Ambulatory Visit: Payer: Self-pay | Admitting: Family Medicine

## 2014-02-14 NOTE — Telephone Encounter (Signed)
Can we refill these? Does pt need appointment?

## 2014-03-19 ENCOUNTER — Other Ambulatory Visit: Payer: Self-pay | Admitting: Family Medicine

## 2014-03-20 NOTE — Telephone Encounter (Signed)
Can we refill this? 

## 2014-05-28 ENCOUNTER — Other Ambulatory Visit: Payer: Self-pay | Admitting: Family Medicine

## 2014-05-29 NOTE — Telephone Encounter (Signed)
Pt has appointment schedule to see Dr. Sarajane Jews in May 2016, will refill scripts until then.

## 2014-06-12 ENCOUNTER — Other Ambulatory Visit (INDEPENDENT_AMBULATORY_CARE_PROVIDER_SITE_OTHER): Payer: BLUE CROSS/BLUE SHIELD

## 2014-06-12 DIAGNOSIS — Z0000 Encounter for general adult medical examination without abnormal findings: Secondary | ICD-10-CM

## 2014-06-12 LAB — POCT URINALYSIS DIPSTICK
BILIRUBIN UA: NEGATIVE
KETONES UA: NEGATIVE
Leukocytes, UA: NEGATIVE
Nitrite, UA: NEGATIVE
Protein, UA: NEGATIVE
SPEC GRAV UA: 1.02
Urobilinogen, UA: 0.2
pH, UA: 5.5

## 2014-06-12 LAB — CBC WITH DIFFERENTIAL/PLATELET
BASOS ABS: 0 10*3/uL (ref 0.0–0.1)
Basophils Relative: 0.5 % (ref 0.0–3.0)
EOS ABS: 0.3 10*3/uL (ref 0.0–0.7)
EOS PCT: 4 % (ref 0.0–5.0)
HEMATOCRIT: 36.5 % — AB (ref 39.0–52.0)
Hemoglobin: 12.5 g/dL — ABNORMAL LOW (ref 13.0–17.0)
LYMPHS ABS: 1.5 10*3/uL (ref 0.7–4.0)
Lymphocytes Relative: 24.2 % (ref 12.0–46.0)
MCHC: 34.2 g/dL (ref 30.0–36.0)
MCV: 81.9 fl (ref 78.0–100.0)
Monocytes Absolute: 0.3 10*3/uL (ref 0.1–1.0)
Monocytes Relative: 5.3 % (ref 3.0–12.0)
NEUTROS ABS: 4.2 10*3/uL (ref 1.4–7.7)
NEUTROS PCT: 66 % (ref 43.0–77.0)
Platelets: 289 10*3/uL (ref 150.0–400.0)
RBC: 4.46 Mil/uL (ref 4.22–5.81)
RDW: 14.4 % (ref 11.5–15.5)
WBC: 6.4 10*3/uL (ref 4.0–10.5)

## 2014-06-12 LAB — BASIC METABOLIC PANEL
BUN: 17 mg/dL (ref 6–23)
CO2: 25 mEq/L (ref 19–32)
Calcium: 9.2 mg/dL (ref 8.4–10.5)
Chloride: 103 mEq/L (ref 96–112)
Creatinine, Ser: 1.01 mg/dL (ref 0.40–1.50)
GFR: 79.37 mL/min (ref >60.00)
GLUCOSE: 163 mg/dL — AB (ref 70–99)
POTASSIUM: 4.1 meq/L (ref 3.5–5.1)
SODIUM: 134 meq/L — AB (ref 135–145)

## 2014-06-12 LAB — PSA: PSA: 1.35 ng/mL (ref 0.10–4.00)

## 2014-06-12 LAB — MICROALBUMIN / CREATININE URINE RATIO
Creatinine,U: 128.6 mg/dL
MICROALB/CREAT RATIO: 1 mg/g (ref 0.0–30.0)
Microalb, Ur: 1.3 mg/dL (ref 0.0–1.9)

## 2014-06-12 LAB — LIPID PANEL
CHOLESTEROL: 72 mg/dL (ref 0–200)
HDL: 22.3 mg/dL — ABNORMAL LOW (ref >39.00)
LDL Cholesterol: 24 mg/dL (ref 0–99)
NONHDL: 49.7
TRIGLYCERIDES: 129 mg/dL (ref 0.0–149.0)
Total CHOL/HDL Ratio: 3
VLDL: 25.8 mg/dL (ref 0.0–40.0)

## 2014-06-12 LAB — HEPATIC FUNCTION PANEL
ALBUMIN: 3.9 g/dL (ref 3.5–5.2)
ALT: 21 U/L (ref 0–53)
AST: 16 U/L (ref 0–37)
Alkaline Phosphatase: 126 U/L — ABNORMAL HIGH (ref 39–117)
Bilirubin, Direct: 0.3 mg/dL (ref 0.0–0.3)
Total Bilirubin: 1.2 mg/dL (ref 0.2–1.2)
Total Protein: 6.8 g/dL (ref 6.0–8.3)

## 2014-06-12 LAB — TSH: TSH: 0.85 u[IU]/mL (ref 0.35–4.50)

## 2014-06-12 LAB — HEMOGLOBIN A1C: HEMOGLOBIN A1C: 8.5 % — AB (ref 4.6–6.5)

## 2014-06-18 ENCOUNTER — Ambulatory Visit (INDEPENDENT_AMBULATORY_CARE_PROVIDER_SITE_OTHER): Payer: BLUE CROSS/BLUE SHIELD | Admitting: Family Medicine

## 2014-06-18 ENCOUNTER — Encounter: Payer: Self-pay | Admitting: Family Medicine

## 2014-06-18 VITALS — BP 115/64 | HR 52 | Temp 98.3°F

## 2014-06-18 DIAGNOSIS — Z0000 Encounter for general adult medical examination without abnormal findings: Secondary | ICD-10-CM | POA: Diagnosis not present

## 2014-06-18 MED ORDER — ATORVASTATIN CALCIUM 20 MG PO TABS: 20.0000 mg | ORAL_TABLET | Freq: Every evening | ORAL | Status: DC

## 2014-06-18 MED ORDER — SAXAGLIPTIN HCL 5 MG PO TABS: 5.0000 mg | ORAL_TABLET | Freq: Every day | ORAL | Status: DC

## 2014-06-18 MED ORDER — AMLODIPINE BESYLATE 10 MG PO TABS: 10.0000 mg | ORAL_TABLET | Freq: Every day | ORAL | Status: DC

## 2014-06-18 MED ORDER — METFORMIN HCL 500 MG PO TABS: 500.0000 mg | ORAL_TABLET | Freq: Two times a day (BID) | ORAL | Status: DC

## 2014-06-18 MED ORDER — GLIPIZIDE 10 MG PO TABS: 10.0000 mg | ORAL_TABLET | Freq: Two times a day (BID) | ORAL | Status: DC

## 2014-06-18 MED ORDER — METOPROLOL TARTRATE 100 MG PO TABS: 100.0000 mg | ORAL_TABLET | Freq: Two times a day (BID) | ORAL | Status: DC

## 2014-06-18 NOTE — Progress Notes (Signed)
Pre visit review using our clinic review tool, if applicable. No additional management support is needed unless otherwise documented below in the visit note. Pt unable to stand to weigh or get a height

## 2014-06-18 NOTE — Progress Notes (Signed)
   Subjective:    Patient ID: Dwayne Simpson, male    DOB: [birth year], 64 y.o.   MRN: [id number]  HPI 64 yr old male for a cpx. He feels well except he has had some diarrhea the past month. No pain or fever or nausea. He thinks it may be due to his Metformin. His recent A1c was up to over 8 because he lost track of his refills somehow and he stopped taking both the Onglyza and the Glipizide.    Review of Systems  Constitutional: Negative.   HENT: Negative.   Eyes: Negative.   Respiratory: Negative.   Cardiovascular: Negative.   Gastrointestinal: Negative.   Genitourinary: Negative.   Musculoskeletal: Negative.   Skin: Negative.   Neurological: Negative.   Psychiatric/Behavioral: Negative.        Objective:   Physical Exam  Constitutional: He is oriented to person, place, and time. He appears well-developed and well-nourished. No distress.  HENT:  Head: Normocephalic and atraumatic.  Right Ear: External ear normal.  Left Ear: External ear normal.  Nose: Nose normal.  Mouth/Throat: Oropharynx is clear and moist. No oropharyngeal exudate.  Eyes: Conjunctivae and EOM are normal. Pupils are equal, round, and reactive to light. Right eye exhibits no discharge. Left eye exhibits no discharge. No scleral icterus.  Neck: Neck supple. No JVD present. No tracheal deviation present. No thyromegaly present.  Cardiovascular: Normal rate, regular rhythm, normal heart sounds and intact distal pulses.  Exam reveals no gallop and no friction rub.   No murmur heard. EKG shows sinus bradycardia   Pulmonary/Chest: Effort normal and breath sounds normal. No respiratory distress. He has no wheezes. He has no rales. He exhibits no tenderness.  Abdominal: Soft. Bowel sounds are normal. He exhibits no distension and no mass. There is no tenderness. There is no rebound and no guarding.  Genitourinary: Rectum normal, prostate normal and penis normal. Guaiac negative stool. No penile tenderness.    Musculoskeletal: Normal range of motion. He exhibits no edema or tenderness.  Lymphadenopathy:    He has no cervical adenopathy.  Neurological: He is alert and oriented to person, place, and time. He has normal reflexes. No cranial nerve deficit. He exhibits normal muscle tone. Coordination normal.  Skin: Skin is warm and dry. No rash noted. He is not diaphoretic. No erythema. No pallor.  Psychiatric: He has a normal mood and affect. His behavior is normal. Judgment and thought content normal.          Assessment & Plan:  Well exam. We will decrease the Metformin dose to 500 mg bid to help the diarrhea, but he will get back on the Onglyza and Glipizide. Recheck in one month

## 2014-06-20 ENCOUNTER — Other Ambulatory Visit: Payer: Self-pay | Admitting: Family Medicine

## 2014-08-17 ENCOUNTER — Other Ambulatory Visit: Payer: Self-pay | Admitting: Family Medicine

## 2014-10-22 ENCOUNTER — Telehealth: Payer: Self-pay | Admitting: Family Medicine

## 2014-10-22 DIAGNOSIS — E119 Type 2 diabetes mellitus without complications: Secondary | ICD-10-CM

## 2014-10-22 NOTE — Telephone Encounter (Signed)
Pt has sores that will not heal and wants to be seen

## 2014-10-22 NOTE — Telephone Encounter (Signed)
He only needs a lab appt for an A1c. The order is in place

## 2014-10-22 NOTE — Telephone Encounter (Signed)
Pt instructed to fu in 3 months on labs. Pt not sure what labs that was for.  Pt wants to know if he should come in prior to appt to get labs done, mo orders in the system? Pls advise

## 2014-10-23 ENCOUNTER — Other Ambulatory Visit (INDEPENDENT_AMBULATORY_CARE_PROVIDER_SITE_OTHER): Payer: BLUE CROSS/BLUE SHIELD

## 2014-10-23 DIAGNOSIS — E119 Type 2 diabetes mellitus without complications: Secondary | ICD-10-CM | POA: Diagnosis not present

## 2014-10-23 LAB — HEMOGLOBIN A1C: Hgb A1c MFr Bld: 6.1 % (ref 4.6–6.5)

## 2014-10-24 ENCOUNTER — Encounter: Payer: Self-pay | Admitting: Family Medicine

## 2014-10-24 ENCOUNTER — Ambulatory Visit (INDEPENDENT_AMBULATORY_CARE_PROVIDER_SITE_OTHER): Payer: BLUE CROSS/BLUE SHIELD | Admitting: Family Medicine

## 2014-10-24 VITALS — BP 101/60 | HR 62 | Temp 98.5°F

## 2014-10-24 DIAGNOSIS — L89301 Pressure ulcer of unspecified buttock, stage 1: Secondary | ICD-10-CM | POA: Diagnosis not present

## 2014-10-24 DIAGNOSIS — Z23 Encounter for immunization: Secondary | ICD-10-CM | POA: Diagnosis not present

## 2014-10-24 DIAGNOSIS — E119 Type 2 diabetes mellitus without complications: Secondary | ICD-10-CM | POA: Diagnosis not present

## 2014-10-24 DIAGNOSIS — R197 Diarrhea, unspecified: Secondary | ICD-10-CM

## 2014-10-24 MED ORDER — TEGADERM ALGINATE AG DRESSING EX PADS: 1.0000 | MEDICATED_PAD | Freq: Every day | CUTANEOUS | Status: DC

## 2014-10-24 NOTE — Addendum Note (Signed)
Addended by: Aggie Hacker A on: 10/24/2014 03:37 PM   Modules accepted: Orders

## 2014-10-24 NOTE — Progress Notes (Signed)
   Subjective:    Patient ID: Dwayne Simpson, male    DOB: [birth year], 64 y.o.   MRN: [id number]  HPI Here for several issues. First he has daily diarrhea which makes it hard for him to work or leave is house. A likely source of this the Metformin he takes. His diabetes as been well controlled, and his A1c 4 weeks ago was excellent at 6.1. Also he has developed some pressure sores on is buttocks. He spends his entire day sitting in his wheelchair. He uses a firm cushion.    Review of Systems  Constitutional: Negative.   Respiratory: Negative.   Cardiovascular: Negative.   Gastrointestinal: Positive for diarrhea. Negative for nausea, vomiting, abdominal pain, constipation, blood in stool, abdominal distention, anal bleeding and rectal pain.  Skin: Positive for wound.       Objective:   Physical Exam  Constitutional: He appears well-developed and well-nourished.  Cardiovascular: Normal rate, regular rhythm, normal heart sounds and intact distal pulses.   Pulmonary/Chest: Effort normal and breath sounds normal.  Abdominal: Soft. Bowel sounds are normal. He exhibits no distension and no mass. There is no tenderness. There is no rebound and no guarding.  Skin:  He has two stage 1 pressure sores over the buttocks, one over each ischial tuberosity          Assessment & Plan:  The diarrhea is likely a side effect of the Metformin, so he will stop taking this. His diabetes is well controlled. He has some pressure sores, so we will treat these with daily Tegaderm patches. He will get a better gel cushion for the wheelchair.

## 2014-10-24 NOTE — Progress Notes (Signed)
Pre visit review using our clinic review tool, if applicable. No additional management support is needed unless otherwise documented below in the visit note. Pt unable to weigh 

## 2014-11-10 ENCOUNTER — Other Ambulatory Visit: Payer: Self-pay | Admitting: Family Medicine

## 2015-01-31 ENCOUNTER — Telehealth: Payer: Self-pay | Admitting: Family Medicine

## 2015-01-31 NOTE — Telephone Encounter (Signed)
Pt call to say that he has a summons for jury duty and every time  he goes there they tell him he is excused. He call today to ask for a note to be excused from jury duty.

## 2015-02-01 NOTE — Telephone Encounter (Signed)
The note was written  

## 2015-02-01 NOTE — Telephone Encounter (Signed)
I left a voice message for pt. 

## 2015-03-06 IMAGING — CR DG FOOT 2V*R*
2 series · 2 of 2 positions shown · non-contrast
Comparison: None.

CLINICAL DATA: Laceration of right foot.

EXAM:
RIGHT FOOT - 2 VIEW

[x foot ap right]
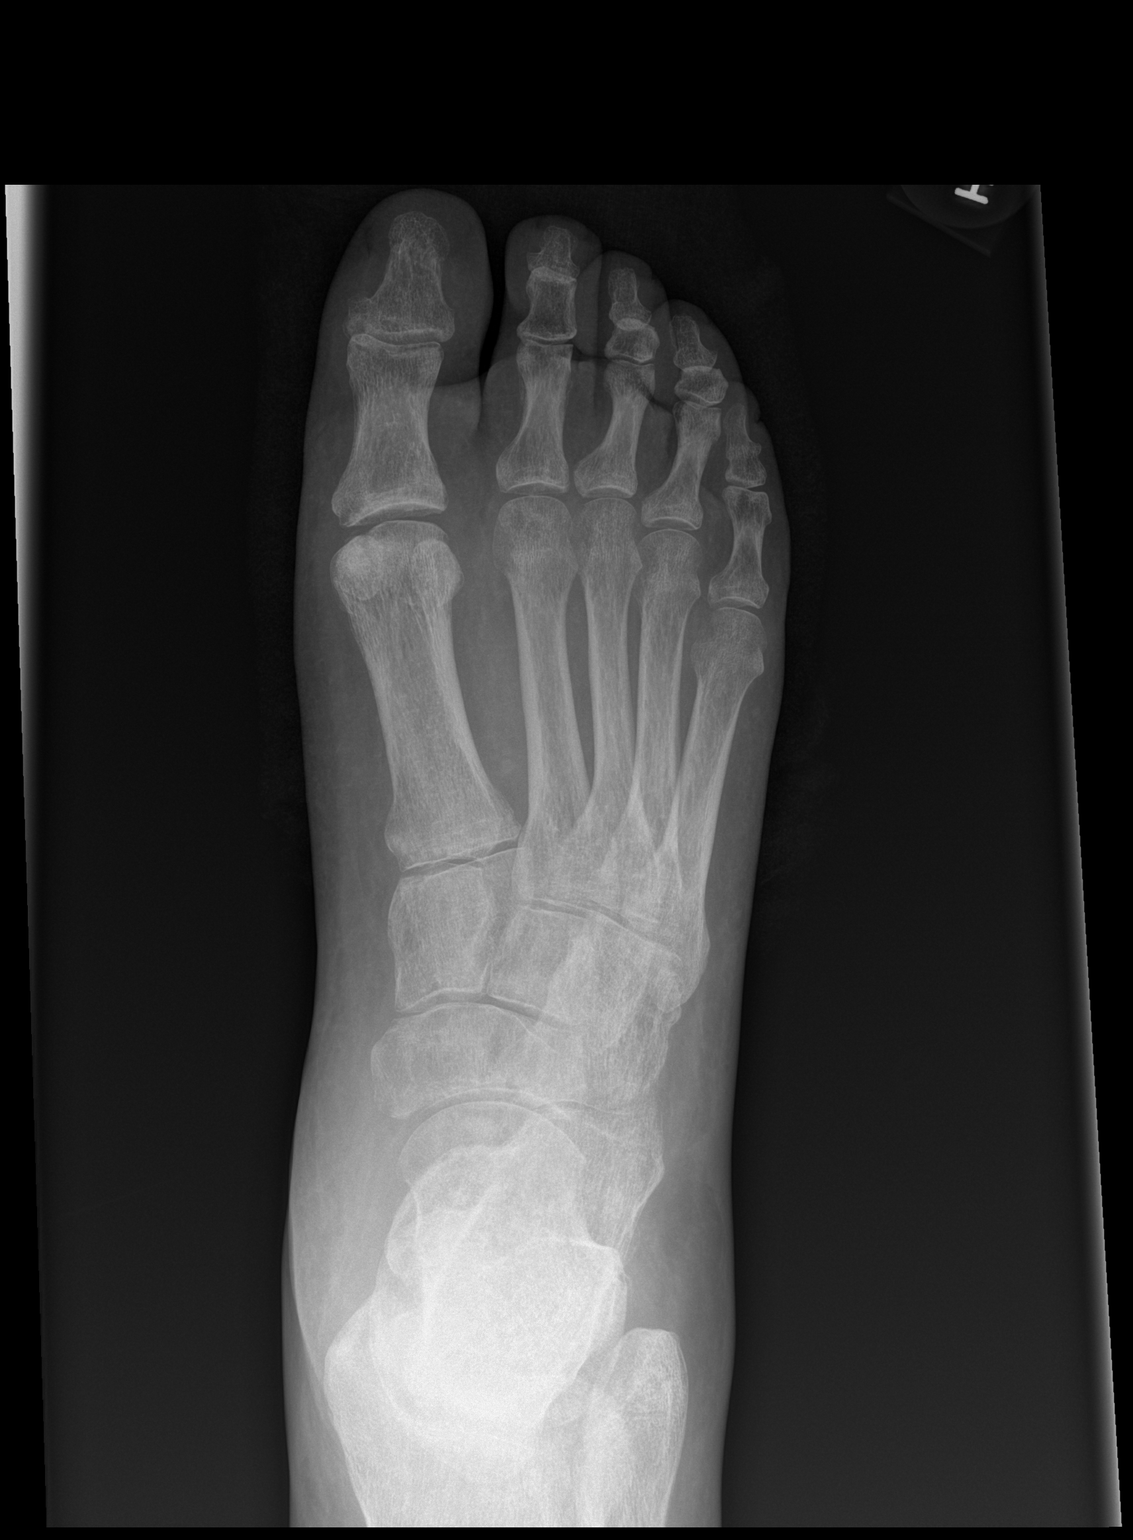

[x foot lat right]
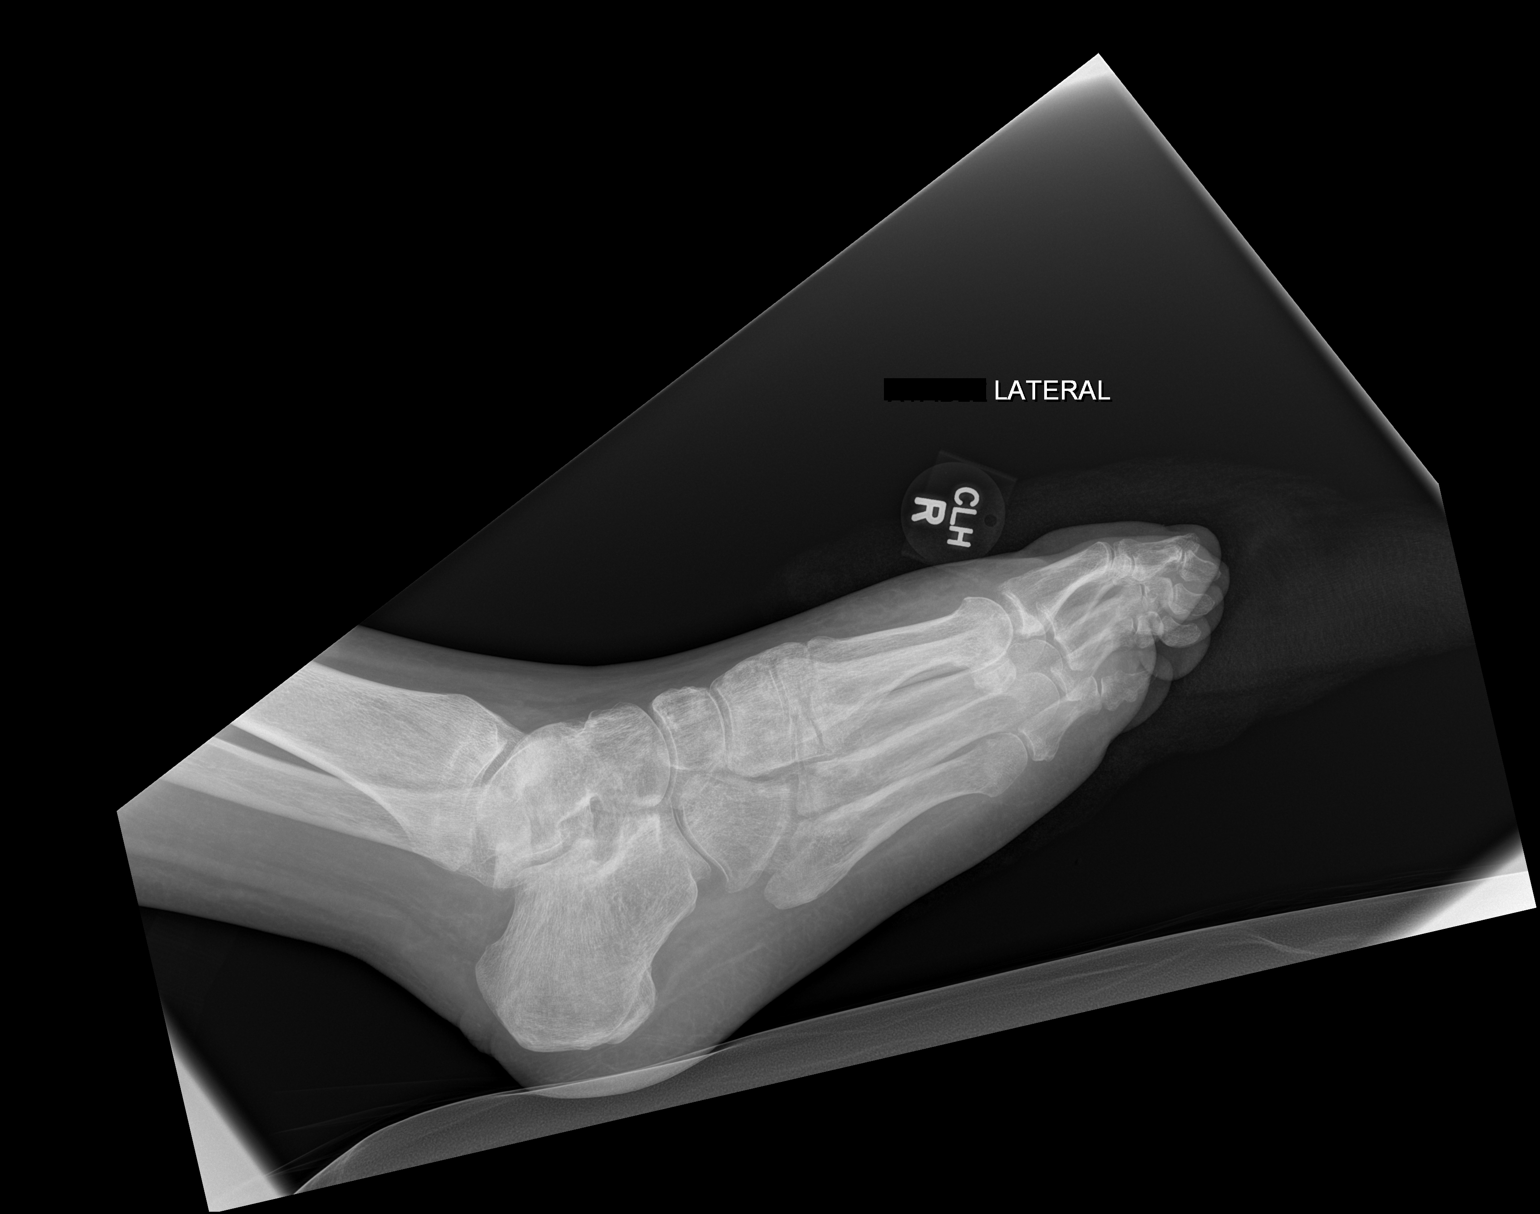

[2 of 2 positions shown; findings below may reference images not displayed]

FINDINGS: There is no evidence of fracture or dislocation. There is
generalized edema of the soft tissues of the foot. There is diffuse
osteopenia. There is no radiopaque foreign body.
IMPRESSION: No acute fracture or dislocation.

## 2015-03-30 ENCOUNTER — Other Ambulatory Visit: Payer: Self-pay | Admitting: Family Medicine

## 2015-06-25 ENCOUNTER — Other Ambulatory Visit: Payer: Self-pay | Admitting: Family Medicine

## 2015-07-02 ENCOUNTER — Other Ambulatory Visit: Payer: Self-pay | Admitting: Family Medicine

## 2015-07-16 ENCOUNTER — Encounter: Payer: Self-pay | Admitting: Family Medicine

## 2015-07-16 ENCOUNTER — Ambulatory Visit (INDEPENDENT_AMBULATORY_CARE_PROVIDER_SITE_OTHER): Payer: BLUE CROSS/BLUE SHIELD | Admitting: Family Medicine

## 2015-07-16 VITALS — BP 104/62 | HR 59 | Temp 98.5°F

## 2015-07-16 DIAGNOSIS — L309 Dermatitis, unspecified: Secondary | ICD-10-CM

## 2015-07-16 MED ORDER — HALOBETASOL PROPIONATE 0.05 % EX OINT: TOPICAL_OINTMENT | Freq: Two times a day (BID) | CUTANEOUS | Status: DC

## 2015-07-16 NOTE — Progress Notes (Signed)
Pre visit review using our clinic review tool, if applicable. No additional management support is needed unless otherwise documented below in the visit note. 

## 2015-07-16 NOTE — Progress Notes (Signed)
   Subjective:    Patient ID: Dwayne Simpson, male    DOB: [birth year], 65 y.o.   MRN: [id number]  HPI Here for a 67 week old patch of red skin on the left lower leg. It causes not pain or itcing but of course he has very little sensation in the lower legs anyway.    Review of Systems  Constitutional: Negative.   Skin: Positive for rash.       Objective:   Physical Exam  Constitutional: He appears well-developed and well-nourished.  Skin:  Left lower anterior leg has a 4 cm round patch of red scaly skin, not tender or warm           Assessment & Plan:  Eczema, apply Halobetasol ointment bid.  Laurey Morale, MD

## 2015-08-22 ENCOUNTER — Ambulatory Visit (INDEPENDENT_AMBULATORY_CARE_PROVIDER_SITE_OTHER): Payer: BLUE CROSS/BLUE SHIELD | Admitting: Adult Health

## 2015-08-22 ENCOUNTER — Encounter: Payer: Self-pay | Admitting: Adult Health

## 2015-08-22 VITALS — BP 120/50 | Temp 98.2°F

## 2015-08-22 DIAGNOSIS — K529 Noninfective gastroenteritis and colitis, unspecified: Secondary | ICD-10-CM

## 2015-08-22 MED ORDER — ONDANSETRON HCL 4 MG PO TABS: 4.0000 mg | ORAL_TABLET | Freq: Three times a day (TID) | ORAL | Status: DC | PRN

## 2015-08-22 MED ORDER — DIPHENOXYLATE-ATROPINE 2.5-0.025 MG PO TABS: 1.0000 | ORAL_TABLET | Freq: Four times a day (QID) | ORAL | Status: DC | PRN

## 2015-08-22 NOTE — Patient Instructions (Addendum)
It was great meeting you today   I am sorry you are feeling this way. I have sent in a prescription for Lomotil and Zofran. These medications will help with the nausea and diarrhea.   Please let us know if you are not feeling any better.   Start with a bland diet and advance as tolerated  Food Choices to Help Relieve Diarrhea, Adult When you have diarrhea, the foods you eat and your eating habits are very important. Choosing the right foods and drinks can help relieve diarrhea. Also, because diarrhea can last up to 7 days, you need to replace lost fluids and electrolytes (such as sodium, potassium, and chloride) in order to help prevent dehydration.  WHAT GENERAL GUIDELINES DO I NEED TO FOLLOW?  Slowly drink 1 cup (8 oz) of fluid for each episode of diarrhea. If you are getting enough fluid, your urine will be clear or pale yellow.  Eat starchy foods. Some good choices include white rice, white toast, pasta, low-fiber cereal, baked potatoes (without the skin), saltine crackers, and bagels.  Avoid large servings of any cooked vegetables.  Limit fruit to two servings per day. A serving is  cup or 1 small piece.  Choose foods with less than 2 g of fiber per serving.  Limit fats to less than 8 tsp (38 g) per day.  Avoid fried foods.  Eat foods that have probiotics in them. Probiotics can be found in certain dairy products.  Avoid foods and beverages that may increase the speed at which food moves through the stomach and intestines (gastrointestinal tract). Things to avoid include:  High-fiber foods, such as dried fruit, raw fruits and vegetables, nuts, seeds, and whole grain foods.  Spicy foods and high-fat foods.  Foods and beverages sweetened with high-fructose corn syrup, honey, or sugar alcohols such as xylitol, sorbitol, and mannitol. WHAT FOODS ARE RECOMMENDED? Grains White rice. White, [place], or pita breads (fresh or toasted), including plain rolls, buns, or bagels. White  pasta. Saltine, soda, or graham crackers. Pretzels. Low-fiber cereal. Cooked cereals made with water (such as cornmeal, farina, or cream cereals). Plain muffins. Matzo. Melba toast. Zwieback.  Vegetables Potatoes (without the skin). Strained tomato and vegetable juices. Most well-cooked and canned vegetables without seeds. Tender lettuce. Fruits Cooked or canned applesauce, apricots, cherries, fruit cocktail, grapefruit, peaches, pears, or plums. Fresh bananas, apples without skin, cherries, grapes, cantaloupe, grapefruit, peaches, oranges, or plums.  Meat and Other Protein Products Baked or boiled chicken. Eggs. Tofu. Fish. Seafood. Smooth peanut butter. Ground or well-cooked tender beef, ham, veal, lamb, pork, or poultry.  Dairy Plain yogurt, kefir, and unsweetened liquid yogurt. Lactose-free milk, buttermilk, or soy milk. Plain hard cheese. Beverages Sport drinks. Clear broths. Diluted fruit juices (except prune). Regular, caffeine-free sodas such as ginger ale. Water. Decaffeinated teas. Oral rehydration solutions. Sugar-free beverages not sweetened with sugar alcohols. Other Bouillon, broth, or soups made from recommended foods.  The items listed above may not be a complete list of recommended foods or beverages. Contact your dietitian for more options. WHAT FOODS ARE NOT RECOMMENDED? Grains Whole grain, whole wheat, bran, or rye breads, rolls, pastas, crackers, and cereals. Wild or brown rice. Cereals that contain more than 2 g of fiber per serving. Corn tortillas or taco shells. Cooked or dry oatmeal. Granola. Popcorn. Vegetables Raw vegetables. Cabbage, broccoli, Brussels sprouts, artichokes, baked beans, beet greens, corn, kale, legumes, peas, sweet potatoes, and yams. Potato skins. Cooked spinach and cabbage. Fruits Dried fruit, including raisins and dates.  Raw fruits. Stewed or dried prunes. Fresh apples with skin, apricots, mangoes, pears, raspberries, and strawberries.  Meat and  Other Protein Products Chunky peanut butter. Nuts and seeds. Beans and lentils. Berniece Salines.  Dairy High-fat cheeses. Milk, chocolate milk, and beverages made with milk, such as milk shakes. Cream. Ice cream. Sweets and Desserts Sweet rolls, doughnuts, and sweet breads. Pancakes and waffles. Fats and Oils Butter. Cream sauces. Margarine. Salad oils. Plain salad dressings. Olives. Avocados.  Beverages Caffeinated beverages (such as coffee, tea, soda, or energy drinks). Alcoholic beverages. Fruit juices with pulp. Prune juice. Soft drinks sweetened with high-fructose corn syrup or sugar alcohols. Other Coconut. Hot sauce. Chili powder. Mayonnaise. Gravy. Cream-based or milk-based soups.  The items listed above may not be a complete list of foods and beverages to avoid. Contact your dietitian for more information. WHAT SHOULD I DO IF I BECOME DEHYDRATED? Diarrhea can sometimes lead to dehydration. Signs of dehydration include dark urine and dry mouth and skin. If you think you are dehydrated, you should rehydrate with an oral rehydration solution. These solutions can be purchased at pharmacies, retail stores, or online.  Drink -1 cup (120-240 mL) of oral rehydration solution each time you have an episode of diarrhea. If drinking this amount makes your diarrhea worse, try drinking smaller amounts more often. For example, drink 1-3 tsp (5-15 mL) every 5-10 minutes.  A general rule for staying hydrated is to drink 1-2 L of fluid per day. Talk to your health care provider about the specific amount you should be drinking each day. Drink enough fluids to keep your urine clear or pale yellow.   This information is not intended to replace advice given to you by your health care provider. Make sure you discuss any questions you have with your health care provider.   Document Released: 04/18/2003 Document Revised: 02/16/2014 Document Reviewed: 12/19/2012 Elsevier Interactive Patient Education [employer].

## 2015-08-22 NOTE — Progress Notes (Signed)
Subjective:    Patient ID: Dwayne Simpson, male    DOB: 1950/11/14, 65 y.o.   MRN: [id number]  HPI 65 year old male, patient of Dr. Sarajane Jews who I am seeing today for an acute issue of nausea and diarrhea. He reports that over the last 4 days he has been nauseated to the point that he has lost his appetite. Within the the last 24 hours he has had multiple episodes of diarrhea.   He reports that his blood sugars have been WNL  He reports that he has not had any abx use.    Review of Systems  Constitutional: Positive for activity change, appetite change and fatigue. Negative for fever, chills and diaphoresis.  Respiratory: Negative.   Cardiovascular: Negative.   Gastrointestinal: Positive for nausea and diarrhea. Negative for vomiting, constipation and blood in stool.  Neurological: Negative.   All other systems reviewed and are negative.  Past Medical History  Diagnosis Date  . Allergic rhinitis   . History of nephrolithiasis   . Hypertension   . Paraplegia ([place])     from pelvic down--  stands w/ assist and transfers self  . Hyperlipidemia   . T12 spinal cord injury ([place])     [place]  . Wheelchair bound   . Type 2 diabetes mellitus ([place])   . Hyperlipidemia   . Right hydrocele   . Arthritis   . Wears glasses     Social History   Social History  . Marital Status: Married    Spouse Name: N/A  . Number of Children: N/A  . Years of Education: N/A   Occupational History  . Not on file.   Social History Main Topics  . Smoking status: Former Smoker    Types: Cigarettes    Quit date: 03/22/1998  . Smokeless tobacco: Never Used  . Alcohol Use: 0.0 oz/week    0 Standard drinks or equivalent per week     Comment: rare  . Drug Use: No  . Sexual Activity: Not on file   Other Topics Concern  . Not on file   Social History Narrative   Married          Past Surgical History  Procedure Laterality Date  . Lysis of the spinal cord  1980    at T12 injury  fell out of  tree   . Colonoscopy  06/25/08    per Dr. Ardis Hughs benign polyp repeat in 10 years  . Internal and external hemorrhoidectomy  07-31-2008  . Hydrocele excision Right 03/29/2013    Procedure: RIGHT HYDROCELECTOMY ADULT;  Surgeon: Ardis Hughs, MD;  Location: [facility];  Service: Urology;  Laterality: Right;    Family History  Problem Relation Age of Onset  . Coronary artery disease    . Diabetes    . Hypertension    . Liver disease    . Stroke    . Thyroid disease      No Known Allergies  Current Outpatient Prescriptions on File Prior to Visit  Medication Sig Dispense Refill  . ACCU-CHEK AVIVA PLUS test strip TEST ONE EVERY DAY AS DIRECTED 100 each 3  . ACCU-CHEK FASTCLIX LANCETS MISC USE AS DIRECTED TO TEST BLOOD SUGAR DAILY 102 each 3  . amLODipine (NORVASC) 10 MG tablet TAKE 1 TABLET(10 MG) BY MOUTH DAILY 90 tablet 1  . aspirin 81 MG tablet Take 1 tablet (81 mg total) by mouth daily. Resume in 48hrs. 90 tablet  3  . atorvastatin (LIPITOR) 20 MG tablet TAKE 1 TABLET(20 MG) BY MOUTH EVERY EVENING 90 tablet 0  . glipiZIDE (GLUCOTROL) 10 MG tablet TAKE 1 TABLET(10 MG) BY MOUTH TWICE DAILY BEFORE A MEAL 180 tablet 1  . halobetasol (ULTRAVATE) 0.05 % ointment Apply topically 2 (two) times daily. 50 g 2  . hydrochlorothiazide (HYDRODIURIL) 25 MG tablet TAKE 1 TABLET BY MOUTH EVERY DAY 90 tablet 1  . lisinopril (PRINIVIL,ZESTRIL) 20 MG tablet TAKE 1 TABLET BY MOUTH EVERY DAY 90 tablet 2  . metoprolol (LOPRESSOR) 100 MG tablet TAKE 1 TABLET(100 MG) BY MOUTH TWICE DAILY 180 tablet 1  . ONGLYZA 5 MG TABS tablet TAKE 1 TABLET(5 MG) BY MOUTH DAILY 90 tablet 1  . Wound Dressings (TEGADERM ALGINATE AG DRESSING) PADS Apply 1 each topically daily. 30 each 5   No current facility-administered medications on file prior to visit.    BP 120/50 mmHg  Temp(Src) 98.2 F (36.8 C) (Oral)       Objective:   Physical Exam  Constitutional: He is oriented to person,  place, and time. He appears well-developed and well-nourished. No distress.  Cardiovascular: Normal rate, regular rhythm, normal heart sounds and intact distal pulses.  Exam reveals no gallop and no friction rub.   No murmur heard. Pulmonary/Chest: Effort normal and breath sounds normal. No respiratory distress. He has no wheezes. He has no rales. He exhibits no tenderness.  Abdominal: Soft. Bowel sounds are normal. He exhibits no distension and no mass. There is no tenderness. There is no rebound and no guarding.  Neurological: He is alert and oriented to person, place, and time.  Skin: Skin is warm and dry. No rash noted. He is not diaphoretic. No erythema. No pallor.  Psychiatric: He has a normal mood and affect. His behavior is normal. Thought content normal.  Nursing note and vitals reviewed.     Assessment & Plan:  1. Gastroenteritis - ondansetron (ZOFRAN) 4 MG tablet; Take 1 tablet (4 mg total) by mouth every 8 (eight) hours as needed for nausea or vomiting.  Dispense: 20 tablet; Refill: 1 - diphenoxylate-atropine (LOMOTIL) 2.5-0.025 MG tablet; Take 1 tablet by mouth 4 (four) times daily as needed for diarrhea or loose stools.  Dispense: 30 tablet; Refill: 0 - Bland foods and advance as tolerated - Follow up if no improvement   Dorothyann Peng, NP

## 2015-08-31 ENCOUNTER — Other Ambulatory Visit: Payer: Self-pay | Admitting: Family Medicine

## 2015-09-17 ENCOUNTER — Other Ambulatory Visit (INDEPENDENT_AMBULATORY_CARE_PROVIDER_SITE_OTHER): Payer: BLUE CROSS/BLUE SHIELD

## 2015-09-17 DIAGNOSIS — Z0000 Encounter for general adult medical examination without abnormal findings: Secondary | ICD-10-CM | POA: Diagnosis not present

## 2015-09-17 LAB — BASIC METABOLIC PANEL
BUN: 15 mg/dL (ref 6–23)
CHLORIDE: 105 meq/L (ref 96–112)
CO2: 22 mEq/L (ref 19–32)
Calcium: 8.8 mg/dL (ref 8.4–10.5)
Creatinine, Ser: 0.88 mg/dL (ref 0.40–1.50)
GFR: 92.68 mL/min (ref >60.00)
GLUCOSE: 138 mg/dL — AB (ref 70–99)
POTASSIUM: 4.3 meq/L (ref 3.5–5.1)
SODIUM: 136 meq/L (ref 135–145)

## 2015-09-17 LAB — CBC WITH DIFFERENTIAL/PLATELET
BASOS ABS: 0 10*3/uL (ref 0.0–0.1)
BASOS PCT: 0.6 % (ref 0.0–3.0)
Eosinophils Absolute: 0.2 10*3/uL (ref 0.0–0.7)
Eosinophils Relative: 2.7 % (ref 0.0–5.0)
HEMATOCRIT: 33.3 % — AB (ref 39.0–52.0)
HEMOGLOBIN: 11.5 g/dL — AB (ref 13.0–17.0)
LYMPHS PCT: 27.8 % (ref 12.0–46.0)
Lymphs Abs: 1.7 10*3/uL (ref 0.7–4.0)
MCHC: 34.7 g/dL (ref 30.0–36.0)
MCV: 82 fl (ref 78.0–100.0)
MONOS PCT: 5.2 % (ref 3.0–12.0)
Monocytes Absolute: 0.3 10*3/uL (ref 0.1–1.0)
NEUTROS ABS: 4 10*3/uL (ref 1.4–7.7)
Neutrophils Relative %: 63.7 % (ref 43.0–77.0)
PLATELETS: 247 10*3/uL (ref 150.0–400.0)
RBC: 4.06 Mil/uL — ABNORMAL LOW (ref 4.22–5.81)
RDW: 13.7 % (ref 11.5–15.5)
WBC: 6.3 10*3/uL (ref 4.0–10.5)

## 2015-09-17 LAB — HEMOGLOBIN A1C: HEMOGLOBIN A1C: 7.5 % — AB (ref 4.6–6.5)

## 2015-09-17 LAB — LIPID PANEL
CHOL/HDL RATIO: 3
Cholesterol: 92 mg/dL (ref 0–200)
HDL: 34.9 mg/dL — AB (ref >39.00)
LDL CALC: 43 mg/dL (ref 0–99)
NONHDL: 57.44
TRIGLYCERIDES: 72 mg/dL (ref 0.0–149.0)
VLDL: 14.4 mg/dL (ref 0.0–40.0)

## 2015-09-17 LAB — HEPATIC FUNCTION PANEL
ALK PHOS: 124 U/L — AB (ref 39–117)
ALT: 19 U/L (ref 0–53)
AST: 17 U/L (ref 0–37)
Albumin: 3.7 g/dL (ref 3.5–5.2)
Bilirubin, Direct: 0.3 mg/dL (ref 0.0–0.3)
Total Bilirubin: 1.1 mg/dL (ref 0.2–1.2)
Total Protein: 6.7 g/dL (ref 6.0–8.3)

## 2015-09-17 LAB — POC URINALSYSI DIPSTICK (AUTOMATED)
BILIRUBIN UA: NEGATIVE
GLUCOSE UA: NEGATIVE
KETONES UA: NEGATIVE
Nitrite, UA: POSITIVE
Protein, UA: NEGATIVE
SPEC GRAV UA: 1.015
UROBILINOGEN UA: 0.2
pH, UA: 5

## 2015-09-17 LAB — MICROALBUMIN / CREATININE URINE RATIO
Creatinine,U: 67.1 mg/dL
MICROALB/CREAT RATIO: 5.5 mg/g (ref 0.0–30.0)
Microalb, Ur: 3.7 mg/dL — ABNORMAL HIGH (ref 0.0–1.9)

## 2015-09-17 LAB — PSA: PSA: 4.28 ng/mL — AB (ref 0.10–4.00)

## 2015-09-17 LAB — TSH: TSH: 1.41 u[IU]/mL (ref 0.35–4.50)

## 2015-09-18 ENCOUNTER — Other Ambulatory Visit: Payer: Self-pay | Admitting: Family Medicine

## 2015-09-18 MED ORDER — CIPROFLOXACIN HCL 500 MG PO TABS: 500.0000 mg | ORAL_TABLET | Freq: Two times a day (BID) | ORAL | 0 refills | Status: DC

## 2015-09-23 ENCOUNTER — Ambulatory Visit (INDEPENDENT_AMBULATORY_CARE_PROVIDER_SITE_OTHER): Payer: BLUE CROSS/BLUE SHIELD | Admitting: Family Medicine

## 2015-09-23 ENCOUNTER — Encounter: Payer: Self-pay | Admitting: Family Medicine

## 2015-09-23 VITALS — BP 112/74 | HR 53 | Temp 98.5°F

## 2015-09-23 DIAGNOSIS — Z0000 Encounter for general adult medical examination without abnormal findings: Secondary | ICD-10-CM

## 2015-09-23 DIAGNOSIS — R972 Elevated prostate specific antigen [PSA]: Secondary | ICD-10-CM | POA: Diagnosis not present

## 2015-09-23 DIAGNOSIS — T1490XS Injury, unspecified, sequela: Secondary | ICD-10-CM | POA: Insufficient documentation

## 2015-09-23 DIAGNOSIS — G8220 Paraplegia, unspecified: Secondary | ICD-10-CM

## 2015-09-23 DIAGNOSIS — D649 Anemia, unspecified: Secondary | ICD-10-CM

## 2015-09-23 MED ORDER — ATORVASTATIN CALCIUM 20 MG PO TABS: ORAL_TABLET | ORAL | 11 refills | Status: DC

## 2015-09-23 MED ORDER — LISINOPRIL 20 MG PO TABS: 20.0000 mg | ORAL_TABLET | Freq: Every day | ORAL | 11 refills | Status: DC

## 2015-09-23 NOTE — Progress Notes (Signed)
Pre visit review using our clinic review tool, if applicable. No additional management support is needed unless otherwise documented below in the visit note. Pt unable to stand and weigh.   

## 2015-09-23 NOTE — Progress Notes (Signed)
Subjective:    Patient ID: Dwayne Simpson, male    DOB: 1951-01-18, 65 y.o.   MRN: [id number]  HPI 65 yr old male for a well exam. He is doing well in general. He does have some mild diarrhea a few times a week. No abdominal pains. He has a chronic left buttock pressure sore that is slowly healing in. His recent labs showed some mild anemia (Hgb of 11.5)  and a UTI, for which we prescribed 14 days of Cipro. He has no urinary symptoms but of course his pelvic sensory nerves were damaged somewhat by his spinal injury. His labs also showed a mild increase in the PSA from 1.35 a year ago to 4.28.    Review of Systems  HENT: Negative.   Eyes: Negative.   Respiratory: Negative.   Cardiovascular: Negative.   Gastrointestinal: Positive for diarrhea. Negative for abdominal distention, abdominal pain, anal bleeding, blood in stool, constipation, nausea, rectal pain and vomiting.  Genitourinary: Negative.   Musculoskeletal: Positive for gait problem. Negative for arthralgias, back pain, joint swelling, myalgias, neck pain and neck stiffness.  Skin: Negative.   Neurological: Positive for weakness. Negative for dizziness, tremors, seizures, syncope, facial asymmetry, speech difficulty, light-headedness, numbness and headaches.  Psychiatric/Behavioral: Negative.        Objective:   Physical Exam  Constitutional: He is oriented to person, place, and time. He appears well-developed and well-nourished. No distress.  In his wheelchair. He is able to stand briefly but cannot walk   HENT:  Head: Normocephalic and atraumatic.  Right Ear: External ear normal.  Left Ear: External ear normal.  Nose: Nose normal.  Mouth/Throat: Oropharynx is clear and moist. No oropharyngeal exudate.  Eyes: Conjunctivae and EOM are normal. Pupils are equal, round, and reactive to light. Right eye exhibits no discharge. Left eye exhibits no discharge. No scleral icterus.  Neck: Neck supple. No JVD present. No tracheal  deviation present. No thyromegaly present.  Cardiovascular: Normal rate, regular rhythm, normal heart sounds and intact distal pulses.  Exam reveals no gallop and no friction rub.   No murmur heard. Pulmonary/Chest: Effort normal and breath sounds normal. No respiratory distress. He has no wheezes. He has no rales. He exhibits no tenderness.  Abdominal: Soft. Bowel sounds are normal. He exhibits no distension and no mass. There is no tenderness. There is no rebound and no guarding.  Genitourinary: Rectum normal, prostate normal and penis normal. Rectal exam shows guaiac negative stool. No penile tenderness.  Musculoskeletal: Normal range of motion. He exhibits no edema or tenderness.  Lymphadenopathy:    He has no cervical adenopathy.  Neurological: He is alert and oriented to person, place, and time. No cranial nerve deficit.  Skin: Skin is warm and dry. No rash noted. He is not diaphoretic. No erythema. No pallor.  Psychiatric: He has a normal mood and affect. His behavior is normal. Judgment and thought content normal.          Assessment & Plan:  Well exam. We discussed diet and exercise. He has a UTI and he likely has a prostatitis as well. I think this is the explanation for the rise in the PSA. He will finish the course of Cipro, and we will repeat a PSA in 30 days. He will start OTC iron pills daily for the anemia and we will recheck a CBC in 3 months. He has functional diarrhea and he can use Imodium prn for this. I also suggested he try a probiotic like  Align daily.

## 2015-09-24 ENCOUNTER — Encounter: Payer: BLUE CROSS/BLUE SHIELD | Admitting: Family Medicine

## 2015-11-13 ENCOUNTER — Other Ambulatory Visit (INDEPENDENT_AMBULATORY_CARE_PROVIDER_SITE_OTHER): Payer: BLUE CROSS/BLUE SHIELD

## 2015-11-13 DIAGNOSIS — R972 Elevated prostate specific antigen [PSA]: Secondary | ICD-10-CM

## 2015-11-13 DIAGNOSIS — D649 Anemia, unspecified: Secondary | ICD-10-CM | POA: Diagnosis not present

## 2015-11-13 LAB — CBC WITH DIFFERENTIAL/PLATELET
BASOS PCT: 0.3 % (ref 0.0–3.0)
Basophils Absolute: 0 10*3/uL (ref 0.0–0.1)
EOS ABS: 0.2 10*3/uL (ref 0.0–0.7)
Eosinophils Relative: 3.1 % (ref 0.0–5.0)
HEMATOCRIT: 34.9 % — AB (ref 39.0–52.0)
Hemoglobin: 12.1 g/dL — ABNORMAL LOW (ref 13.0–17.0)
LYMPHS ABS: 1.7 10*3/uL (ref 0.7–4.0)
Lymphocytes Relative: 28.2 % (ref 12.0–46.0)
MCHC: 34.6 g/dL (ref 30.0–36.0)
MCV: 83.1 fl (ref 78.0–100.0)
MONO ABS: 0.3 10*3/uL (ref 0.1–1.0)
Monocytes Relative: 5.4 % (ref 3.0–12.0)
NEUTROS ABS: 3.9 10*3/uL (ref 1.4–7.7)
Neutrophils Relative %: 63 % (ref 43.0–77.0)
PLATELETS: 285 10*3/uL (ref 150.0–400.0)
RBC: 4.2 Mil/uL — ABNORMAL LOW (ref 4.22–5.81)
RDW: 14.7 % (ref 11.5–15.5)
WBC: 6.1 10*3/uL (ref 4.0–10.5)

## 2015-11-13 LAB — PSA: PSA: 0.77 ng/mL (ref 0.10–4.00)

## 2015-12-15 ENCOUNTER — Other Ambulatory Visit: Payer: Self-pay | Admitting: Family Medicine

## 2016-02-24 ENCOUNTER — Telehealth: Payer: Self-pay

## 2016-02-24 NOTE — Telephone Encounter (Signed)
Received PA request from [facility] for [place]. PA submitted & is pending. Key: Doretha Imus

## 2016-03-02 NOTE — Telephone Encounter (Signed)
PA denied. The formulary products are: Januvia, Tradjenta.

## 2016-03-02 NOTE — Telephone Encounter (Signed)
Stop Onglyza and switch to Januvia 100 mg daily. Call in #[phone] with 3 rf

## 2016-03-03 NOTE — Telephone Encounter (Signed)
I left a voice message for pt to return my call.  

## 2016-03-04 NOTE — Telephone Encounter (Signed)
I tried to reach pt again by phone, no answer. I printed a copy of this information and put in the mail to pt. Advised to contact our office to discuss change in medication.

## 2016-06-23 ENCOUNTER — Telehealth: Payer: Self-pay

## 2016-06-23 ENCOUNTER — Other Ambulatory Visit: Payer: Self-pay | Admitting: Family Medicine

## 2016-06-23 NOTE — Telephone Encounter (Signed)
Received PA request for Onglyza. Pa submitted & is pending. Key: [id number]

## 2016-06-24 NOTE — Telephone Encounter (Signed)
PA denied. Patient has to try Januvia or Tradjenta. See other phone note.

## 2016-08-03 ENCOUNTER — Encounter: Payer: Self-pay | Admitting: Internal Medicine

## 2016-08-03 ENCOUNTER — Ambulatory Visit (INDEPENDENT_AMBULATORY_CARE_PROVIDER_SITE_OTHER): Payer: 59 | Admitting: Internal Medicine

## 2016-08-03 VITALS — BP 132/70 | HR 65 | Temp 98.4°F

## 2016-08-03 DIAGNOSIS — G8220 Paraplegia, unspecified: Secondary | ICD-10-CM | POA: Diagnosis not present

## 2016-08-03 DIAGNOSIS — I10 Essential (primary) hypertension: Secondary | ICD-10-CM | POA: Diagnosis not present

## 2016-08-03 DIAGNOSIS — R3915 Urgency of urination: Secondary | ICD-10-CM | POA: Diagnosis not present

## 2016-08-03 DIAGNOSIS — E119 Type 2 diabetes mellitus without complications: Secondary | ICD-10-CM

## 2016-08-03 DIAGNOSIS — T1490XS Injury, unspecified, sequela: Secondary | ICD-10-CM

## 2016-08-03 LAB — POC URINALSYSI DIPSTICK (AUTOMATED)
Bilirubin, UA: NEGATIVE
Ketones, UA: NEGATIVE
Leukocytes, UA: NEGATIVE
PH UA: 6 (ref 5.0–8.0)
Spec Grav, UA: 1.025 (ref 1.010–1.025)
UROBILINOGEN UA: 0.2 U/dL

## 2016-08-03 MED ORDER — PIOGLITAZONE HCL 15 MG PO TABS: 15.0000 mg | ORAL_TABLET | Freq: Every day | ORAL | 4 refills | Status: DC

## 2016-08-03 MED ORDER — METFORMIN HCL ER (MOD) 500 MG PO TB24: 500.0000 mg | ORAL_TABLET | Freq: Every day | ORAL | 2 refills | Status: DC

## 2016-08-03 MED ORDER — GLIPIZIDE 10 MG PO TABS: ORAL_TABLET | ORAL | 1 refills | Status: DC

## 2016-08-03 NOTE — Patient Instructions (Addendum)
WE NOW OFFER   [facility] FAST TRACK!!!  SAME DAY Appointments for ACUTE CARE  Such as: Sprains, Injuries, cuts, abrasions, rashes, muscle pain, joint pain, back pain Colds, flu, sore throats, headache, allergies, cough, fever  Ear pain, sinus and eye infections Abdominal pain, nausea, vomiting, diarrhea, upset stomach Animal/insect bites  3 Easy Ways to Schedule: Walk-In Scheduling Call in scheduling Mychart Sign-up: [email or web address]   Return in 2 weeks for follow-up with Dr. Sarajane Jews  Report any new or worsening symptoms  Discontinue hydrochlorothiazide  Okay to discontinue metformin.  If diarrhea reoccurs
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
## 2016-08-04 NOTE — Addendum Note (Signed)
Addended by: Tomi Likens on: 08/04/2016 08:28 AM   Modules accepted: Orders

## 2016-08-25 ENCOUNTER — Other Ambulatory Visit: Payer: Self-pay | Admitting: Internal Medicine

## 2016-08-25 ENCOUNTER — Other Ambulatory Visit: Payer: Self-pay | Admitting: Family Medicine

## 2016-09-25 ENCOUNTER — Encounter: Payer: Self-pay | Admitting: Family Medicine

## 2016-09-25 ENCOUNTER — Ambulatory Visit (INDEPENDENT_AMBULATORY_CARE_PROVIDER_SITE_OTHER): Payer: 59 | Admitting: Family Medicine

## 2016-09-25 VITALS — BP 110/66 | HR 54 | Temp 98.9°F

## 2016-09-25 DIAGNOSIS — N401 Enlarged prostate with lower urinary tract symptoms: Secondary | ICD-10-CM

## 2016-09-25 DIAGNOSIS — E782 Mixed hyperlipidemia: Secondary | ICD-10-CM | POA: Diagnosis not present

## 2016-09-25 DIAGNOSIS — N138 Other obstructive and reflux uropathy: Secondary | ICD-10-CM

## 2016-09-25 DIAGNOSIS — I10 Essential (primary) hypertension: Secondary | ICD-10-CM

## 2016-09-25 DIAGNOSIS — E119 Type 2 diabetes mellitus without complications: Secondary | ICD-10-CM

## 2016-09-25 LAB — CBC WITH DIFFERENTIAL/PLATELET
BASOS ABS: 51 {cells}/uL (ref 0–200)
Basophils Relative: 1 %
EOS ABS: 102 {cells}/uL (ref 15–500)
EOS PCT: 2 %
HCT: 35.3 % — ABNORMAL LOW (ref 38.5–50.0)
Hemoglobin: 11.8 g/dL — ABNORMAL LOW (ref 13.2–17.1)
LYMPHS PCT: 33 %
Lymphs Abs: 1683 cells/uL (ref 850–3900)
MCH: 29.4 pg (ref 27.0–33.0)
MCHC: 33.4 g/dL (ref 32.0–36.0)
MCV: 88 fL (ref 80.0–100.0)
MONOS PCT: 6 %
MPV: 9.4 fL (ref 7.5–12.5)
Monocytes Absolute: 306 cells/uL (ref 200–950)
Neutro Abs: 2958 cells/uL (ref 1500–7800)
Neutrophils Relative %: 58 %
PLATELETS: 283 10*3/uL (ref 140–400)
RBC: 4.01 MIL/uL — ABNORMAL LOW (ref 4.20–5.80)
RDW: 13.5 % (ref 11.0–15.0)
WBC: 5.1 10*3/uL (ref 3.8–10.8)

## 2016-09-25 LAB — TSH: TSH: 1 m[IU]/L (ref 0.40–4.50)

## 2016-09-25 NOTE — Patient Instructions (Signed)
WE NOW OFFER   [facility] FAST TRACK!!!  SAME DAY Appointments for ACUTE CARE  Such as: Sprains, Injuries, cuts, abrasions, rashes, muscle pain, joint pain, back pain Colds, flu, sore throats, headache, allergies, cough, fever  Ear pain, sinus and eye infections Abdominal pain, nausea, vomiting, diarrhea, upset stomach Animal/insect bites  3 Easy Ways to Schedule: Walk-In Scheduling Call in scheduling Mychart Sign-up: [email or web address]         

## 2016-09-25 NOTE — Progress Notes (Signed)
   Subjective:    Patient ID: Dwayne Simpson, male    DOB: May 22, 1950, 66 y.o.   MRN: [id number]  HPI Here to follow up on several issues. He was seen in June for his diabetes, when his insurance stopped paying for Onglyza. He was switched to Pioglitazone, and he has done well with this. He feels fine and his am fasting glucoses have run from 120 to 140. His BP has been stable.    Review of Systems  Constitutional: Negative.   Respiratory: Negative.   Cardiovascular: Negative.   Neurological: Negative for headaches.       Objective:   Physical Exam  Constitutional: He is oriented to person, place, and time. He appears well-developed and well-nourished.  Cardiovascular: Normal rate, regular rhythm, normal heart sounds and intact distal pulses.   Pulmonary/Chest: Effort normal and breath sounds normal. No respiratory distress. He has no wheezes. He has no rales.  Musculoskeletal: He exhibits no edema.  Neurological: He is alert and oriented to person, place, and time.          Assessment & Plan:  His HTN is stable. His diabetes appears to be stable. We will get labs today including an A1c to see if adjustments need to be made. We plan to see him back in 2 weeks for a fasting lipid panel and a complete physical exam.  Alysia Penna, MD

## 2016-09-26 LAB — BASIC METABOLIC PANEL
BUN: 22 mg/dL (ref 7–25)
CALCIUM: 9 mg/dL (ref 8.6–10.3)
CHLORIDE: 106 mmol/L (ref 98–110)
CO2: 21 mmol/L (ref 20–32)
CREATININE: 1.19 mg/dL (ref 0.70–1.25)
GLUCOSE: 158 mg/dL — AB (ref 65–99)
Potassium: 4.2 mmol/L (ref 3.5–5.3)
Sodium: 136 mmol/L (ref 135–146)

## 2016-09-26 LAB — HEPATIC FUNCTION PANEL
ALT: 24 U/L (ref 9–46)
AST: 19 U/L (ref 10–35)
Albumin: 4.1 g/dL (ref 3.6–5.1)
Alkaline Phosphatase: 85 U/L (ref 40–115)
BILIRUBIN DIRECT: 0.3 mg/dL — AB (ref <=0.2)
Indirect Bilirubin: 0.8 mg/dL (ref 0.2–1.2)
TOTAL PROTEIN: 6.5 g/dL (ref 6.1–8.1)
Total Bilirubin: 1.1 mg/dL (ref 0.2–1.2)

## 2016-09-26 LAB — PSA: PSA: 3.6 ng/mL (ref <=4.0)

## 2016-09-26 LAB — HEMOGLOBIN A1C
HEMOGLOBIN A1C: 9.1 % — AB (ref <5.7)
Mean Plasma Glucose: 214 mg/dL

## 2016-10-09 ENCOUNTER — Encounter: Payer: Self-pay | Admitting: Family Medicine

## 2016-10-09 ENCOUNTER — Ambulatory Visit (INDEPENDENT_AMBULATORY_CARE_PROVIDER_SITE_OTHER): Payer: 59 | Admitting: Family Medicine

## 2016-10-09 VITALS — BP 122/76 | HR 54 | Temp 98.4°F

## 2016-10-09 DIAGNOSIS — Z23 Encounter for immunization: Secondary | ICD-10-CM | POA: Diagnosis not present

## 2016-10-09 DIAGNOSIS — Z0000 Encounter for general adult medical examination without abnormal findings: Secondary | ICD-10-CM | POA: Diagnosis not present

## 2016-10-09 LAB — LIPID PANEL
CHOL/HDL RATIO: 2
CHOLESTEROL: 100 mg/dL (ref 0–200)
HDL: 43.6 mg/dL (ref >39.00)
LDL CALC: 42 mg/dL (ref 0–99)
NonHDL: 56.62
TRIGLYCERIDES: 72 mg/dL (ref 0.0–149.0)
VLDL: 14.4 mg/dL (ref 0.0–40.0)

## 2016-10-09 MED ORDER — ATORVASTATIN CALCIUM 20 MG PO TABS: 20.0000 mg | ORAL_TABLET | Freq: Every evening | ORAL | 3 refills | Status: DC

## 2016-10-09 MED ORDER — LISINOPRIL 20 MG PO TABS: ORAL_TABLET | ORAL | 3 refills | Status: DC

## 2016-10-09 MED ORDER — ACCU-CHEK FASTCLIX LANCETS MISC: 3 refills | Status: DC

## 2016-10-09 MED ORDER — GLIPIZIDE 10 MG PO TABS: ORAL_TABLET | ORAL | 3 refills | Status: DC

## 2016-10-09 MED ORDER — METOPROLOL TARTRATE 100 MG PO TABS: ORAL_TABLET | ORAL | 3 refills | Status: DC

## 2016-10-09 MED ORDER — GLUCOSE BLOOD VI STRP: ORAL_STRIP | 3 refills | Status: DC

## 2016-10-09 NOTE — Progress Notes (Signed)
   Subjective:    Patient ID: Dwayne Simpson, male    DOB: July 18, 1950, 66 y.o.   MRN: [id number]  HPI Here for a well exam. He is doing well in general. The decubitus ulcer over the left hip is slowly getting smaller. He has not had an eye exam on several years.    Review of Systems  Constitutional: Negative.   HENT: Negative.   Eyes: Negative.   Respiratory: Negative.   Cardiovascular: Negative.   Gastrointestinal: Negative.   Genitourinary: Negative.   Musculoskeletal: Positive for gait problem. Negative for arthralgias, back pain, joint swelling, myalgias, neck pain and neck stiffness.  Skin: Positive for wound. Negative for color change, pallor and rash.  Psychiatric/Behavioral: Negative.        Objective:   Physical Exam  Constitutional: He is oriented to person, place, and time. He appears well-developed and well-nourished. No distress.  In his wheelchair   HENT:  Head: Normocephalic and atraumatic.  Right Ear: External ear normal.  Left Ear: External ear normal.  Nose: Nose normal.  Mouth/Throat: Oropharynx is clear and moist. No oropharyngeal exudate.  Eyes: Pupils are equal, round, and reactive to light. Conjunctivae and EOM are normal. Right eye exhibits no discharge. Left eye exhibits no discharge. No scleral icterus.  Neck: Neck supple. No JVD present. No tracheal deviation present. No thyromegaly present.  Cardiovascular: Normal rate, regular rhythm, normal heart sounds and intact distal pulses.  Exam reveals no gallop and no friction rub.   No murmur heard. Pulmonary/Chest: Effort normal and breath sounds normal. No respiratory distress. He has no wheezes. He has no rales. He exhibits no tenderness.  Abdominal: Soft. Bowel sounds are normal. He exhibits no distension and no mass. There is no tenderness. There is no rebound and no guarding.  Genitourinary: Rectum normal, prostate normal and penis normal. Rectal exam shows guaiac negative stool. No penile tenderness.    Musculoskeletal: Normal range of motion. He exhibits no edema or tenderness.  Lymphadenopathy:    He has no cervical adenopathy.  Neurological: He is alert and oriented to person, place, and time. He has normal reflexes. No cranial nerve deficit. He exhibits normal muscle tone. Coordination normal.  Skin: Skin is warm and dry. No rash noted. He is not diaphoretic. No erythema. No pallor.  There is a 2 cm decubitus ulcer over the left greater trochanter. It is dry and clean and filled with granulation tissue   Psychiatric: He has a normal mood and affect. His behavior is normal. Judgment and thought content normal.          Assessment & Plan:  Well exam. We discussed diet and exercise. Get a fasting lipid panel. He said he will set up an eye exam soon.  Alysia Penna, MD

## 2016-10-09 NOTE — Patient Instructions (Signed)
WE NOW OFFER   [facility] FAST TRACK!!!  SAME DAY Appointments for ACUTE CARE  Such as: Sprains, Injuries, cuts, abrasions, rashes, muscle pain, joint pain, back pain Colds, flu, sore throats, headache, allergies, cough, fever  Ear pain, sinus and eye infections Abdominal pain, nausea, vomiting, diarrhea, upset stomach Animal/insect bites  3 Easy Ways to Schedule: Walk-In Scheduling Call in scheduling Mychart Sign-up: [email or web address]         

## 2016-10-20 ENCOUNTER — Other Ambulatory Visit: Payer: Self-pay | Admitting: Internal Medicine

## 2016-10-28 ENCOUNTER — Ambulatory Visit (INDEPENDENT_AMBULATORY_CARE_PROVIDER_SITE_OTHER): Payer: 59 | Admitting: Family Medicine

## 2016-10-28 ENCOUNTER — Encounter: Payer: Self-pay | Admitting: Family Medicine

## 2016-10-28 VITALS — BP 122/71 | HR 52 | Temp 98.7°F

## 2016-10-28 DIAGNOSIS — N3000 Acute cystitis without hematuria: Secondary | ICD-10-CM

## 2016-10-28 DIAGNOSIS — R350 Frequency of micturition: Secondary | ICD-10-CM | POA: Diagnosis not present

## 2016-10-28 LAB — POC URINALSYSI DIPSTICK (AUTOMATED)
Bilirubin, UA: NEGATIVE
KETONES UA: NEGATIVE
Nitrite, UA: NEGATIVE
Urobilinogen, UA: 0.2 E.U./dL
pH, UA: 6 (ref 5.0–8.0)

## 2016-10-28 MED ORDER — CIPROFLOXACIN HCL 500 MG PO TABS: 500.0000 mg | ORAL_TABLET | Freq: Two times a day (BID) | ORAL | 0 refills | Status: DC

## 2016-10-28 NOTE — Progress Notes (Signed)
   Subjective:    Patient ID: Dwayne Simpson, male    DOB: 1950/12/02, 66 y.o.   MRN: [id number]  HPI Here for 2 days of increased frequency of urinations and having a dark color to the urine. No burning or fever. He drinks plenty of water.    Review of Systems  Constitutional: Negative.   Respiratory: Negative.   Cardiovascular: Negative.   Genitourinary: Positive for frequency and urgency. Negative for flank pain and hematuria.       Objective:   Physical Exam  Constitutional: He appears well-developed and well-nourished. No distress.  Cardiovascular: Normal rate, regular rhythm, normal heart sounds and intact distal pulses.   Pulmonary/Chest: Effort normal and breath sounds normal. No respiratory distress. He has no wheezes. He has no rales.  Abdominal: Soft. Bowel sounds are normal. He exhibits no distension and no mass. There is no tenderness. There is no rebound and no guarding.          Assessment & Plan:  UTI, treat with Cipro. Culture the sample.  Alysia Penna, MD

## 2016-10-28 NOTE — Patient Instructions (Signed)
WE NOW OFFER   [facility] FAST TRACK!!!  SAME DAY Appointments for ACUTE CARE  Such as: Sprains, Injuries, cuts, abrasions, rashes, muscle pain, joint pain, back pain Colds, flu, sore throats, headache, allergies, cough, fever  Ear pain, sinus and eye infections Abdominal pain, nausea, vomiting, diarrhea, upset stomach Animal/insect bites  3 Easy Ways to Schedule: Walk-In Scheduling Call in scheduling Mychart Sign-up: [email or web address]         

## 2016-10-29 ENCOUNTER — Encounter: Payer: Self-pay | Admitting: Family Medicine

## 2016-10-30 LAB — URINE CULTURE
MICRO NUMBER:: 81035962
SPECIMEN QUALITY:: ADEQUATE

## 2016-11-25 ENCOUNTER — Other Ambulatory Visit: Payer: Self-pay | Admitting: Internal Medicine

## 2016-11-25 ENCOUNTER — Other Ambulatory Visit: Payer: Self-pay | Admitting: Family Medicine

## 2016-12-24 ENCOUNTER — Other Ambulatory Visit: Payer: Self-pay | Admitting: Internal Medicine

## 2016-12-24 ENCOUNTER — Other Ambulatory Visit: Payer: Self-pay | Admitting: Family Medicine

## 2017-01-16 ENCOUNTER — Other Ambulatory Visit: Payer: Self-pay | Admitting: Internal Medicine

## 2017-01-20 NOTE — Telephone Encounter (Signed)
Called pharmacy Rx did not go through. Rx was called into pharmacy for the pt.

## 2017-01-26 NOTE — Telephone Encounter (Signed)
Called in.

## 2017-03-08 ENCOUNTER — Other Ambulatory Visit: Payer: Self-pay | Admitting: Family Medicine

## 2017-03-21 ENCOUNTER — Telehealth: Payer: Self-pay | Admitting: Family Medicine

## 2017-03-21 DIAGNOSIS — E119 Type 2 diabetes mellitus without complications: Secondary | ICD-10-CM

## 2017-03-23 NOTE — Telephone Encounter (Signed)
Sent to PCP ?

## 2017-03-23 NOTE — Telephone Encounter (Signed)
The A1c was ordered. He should stop the Glumetza and go back to generic Metformin 500 mg bid. Call in #[phone] with 11 rf.

## 2017-03-23 NOTE — Telephone Encounter (Signed)
Pt called in and states that the metformin extended release is not covered under new insurance plan. Pt cannot afford the $500 for medication. Pt has 1 week on hand. Please advise.   Pt also stated he was supposed to have A1C check last month and is coming in tomorrow for that. Please enter orders as needed.  CAll back [phone]

## 2017-03-23 NOTE — Addendum Note (Signed)
Addended by: Alysia Penna A on: 03/23/2017 05:55 PM   Modules accepted: Orders

## 2017-03-24 ENCOUNTER — Other Ambulatory Visit (INDEPENDENT_AMBULATORY_CARE_PROVIDER_SITE_OTHER): Payer: Medicare Other

## 2017-03-24 DIAGNOSIS — E119 Type 2 diabetes mellitus without complications: Secondary | ICD-10-CM | POA: Diagnosis not present

## 2017-03-24 LAB — HEMOGLOBIN A1C: HEMOGLOBIN A1C: 6.6 % — AB (ref 4.6–6.5)

## 2017-03-24 NOTE — Telephone Encounter (Signed)
Called pt and left a VM to call back.  

## 2017-03-26 ENCOUNTER — Encounter: Payer: Self-pay | Admitting: Family Medicine

## 2017-03-29 ENCOUNTER — Encounter: Payer: Self-pay | Admitting: Family Medicine

## 2017-03-29 ENCOUNTER — Other Ambulatory Visit: Payer: Self-pay | Admitting: Family Medicine

## 2017-03-29 MED ORDER — METFORMIN HCL 500 MG PO TABS: 500.0000 mg | ORAL_TABLET | Freq: Two times a day (BID) | ORAL | 11 refills | Status: DC

## 2017-03-29 NOTE — Telephone Encounter (Signed)
Called pt and left a message on voicemail to call office. Also mailed pt a letter.

## 2017-03-31 NOTE — Telephone Encounter (Signed)
I spoke with pt and went over lab results, also discussed medication change.

## 2017-05-21 ENCOUNTER — Other Ambulatory Visit: Payer: Self-pay | Admitting: Internal Medicine

## 2017-05-24 ENCOUNTER — Other Ambulatory Visit: Payer: Self-pay | Admitting: Family Medicine

## 2017-05-24 NOTE — Telephone Encounter (Signed)
Dr. Fry patient. 

## 2017-05-24 NOTE — Telephone Encounter (Signed)
Dr Fry pt 

## 2017-07-22 ENCOUNTER — Other Ambulatory Visit: Payer: Self-pay | Admitting: Family Medicine

## 2017-10-18 ENCOUNTER — Other Ambulatory Visit: Payer: Self-pay | Admitting: Family Medicine

## 2017-10-25 ENCOUNTER — Ambulatory Visit (INDEPENDENT_AMBULATORY_CARE_PROVIDER_SITE_OTHER): Payer: Medicare Other | Admitting: Family Medicine

## 2017-10-25 ENCOUNTER — Encounter: Payer: Self-pay | Admitting: Family Medicine

## 2017-10-25 VITALS — BP 110/60 | HR 52 | Temp 98.0°F | Wt 202.2 lb

## 2017-10-25 DIAGNOSIS — I10 Essential (primary) hypertension: Secondary | ICD-10-CM

## 2017-10-25 DIAGNOSIS — N401 Enlarged prostate with lower urinary tract symptoms: Secondary | ICD-10-CM | POA: Diagnosis not present

## 2017-10-25 DIAGNOSIS — E119 Type 2 diabetes mellitus without complications: Secondary | ICD-10-CM | POA: Diagnosis not present

## 2017-10-25 DIAGNOSIS — N138 Other obstructive and reflux uropathy: Secondary | ICD-10-CM | POA: Diagnosis not present

## 2017-10-25 DIAGNOSIS — T1490XS Injury, unspecified, sequela: Secondary | ICD-10-CM

## 2017-10-25 DIAGNOSIS — E782 Mixed hyperlipidemia: Secondary | ICD-10-CM

## 2017-10-25 DIAGNOSIS — R609 Edema, unspecified: Secondary | ICD-10-CM | POA: Diagnosis not present

## 2017-10-25 DIAGNOSIS — Z23 Encounter for immunization: Secondary | ICD-10-CM

## 2017-10-25 MED ORDER — METFORMIN HCL 500 MG PO TABS: 500.0000 mg | ORAL_TABLET | Freq: Two times a day (BID) | ORAL | 3 refills | Status: DC

## 2017-10-25 MED ORDER — HYDROCHLOROTHIAZIDE 25 MG PO TABS: 25.0000 mg | ORAL_TABLET | Freq: Every day | ORAL | 3 refills | Status: DC

## 2017-10-25 MED ORDER — AMLODIPINE BESYLATE 10 MG PO TABS: 10.0000 mg | ORAL_TABLET | Freq: Every day | ORAL | 3 refills | Status: DC

## 2017-10-25 MED ORDER — LISINOPRIL 20 MG PO TABS: 20.0000 mg | ORAL_TABLET | Freq: Every day | ORAL | 3 refills | Status: DC

## 2017-10-25 MED ORDER — GLIPIZIDE 10 MG PO TABS: 10.0000 mg | ORAL_TABLET | Freq: Two times a day (BID) | ORAL | 3 refills | Status: DC

## 2017-10-25 MED ORDER — ATORVASTATIN CALCIUM 20 MG PO TABS: ORAL_TABLET | ORAL | 3 refills | Status: DC

## 2017-10-25 MED ORDER — METOPROLOL TARTRATE 100 MG PO TABS: ORAL_TABLET | ORAL | 3 refills | Status: DC

## 2017-10-25 MED ORDER — PIOGLITAZONE HCL 15 MG PO TABS: ORAL_TABLET | ORAL | 3 refills | Status: DC

## 2017-10-25 NOTE — Progress Notes (Signed)
   Subjective:    Patient ID: Dwayne Simpson, male    DOB: [birth year], 67 y.o.   MRN: [id number]  HPI Here to follow up on issues. He feels well in general. He retired earlier this year and he enjoys mowing his grass and [employer] account pl[employer]. His BP has been stable. His am fasting glucoses run from 100-110 mostly. He notes his wheelchair is about 66 years old and he would like new one.    Review of Systems  Constitutional: Negative.   HENT: Negative.   Eyes: Negative.   Respiratory: Negative.   Cardiovascular: Negative.   Gastrointestinal: Negative.   Genitourinary: Negative.   Musculoskeletal: Negative.   Skin: Negative.   Neurological: Negative.   Psychiatric/Behavioral: Negative.        Objective:   Physical Exam  Constitutional: He is oriented to person, place, and time. He appears well-developed and well-nourished. No distress.  In a wheelchair. He can transfer and stand by himself.   HENT:  Head: Normocephalic and atraumatic.  Right Ear: External ear normal.  Left Ear: External ear normal.  Nose: Nose normal.  Mouth/Throat: Oropharynx is clear and moist. No oropharyngeal exudate.  Eyes: Pupils are equal, round, and reactive to light. Conjunctivae and EOM are normal. Right eye exhibits no discharge. Left eye exhibits no discharge. No scleral icterus.  Neck: Neck supple. No JVD present. No tracheal deviation present. No thyromegaly present.  Cardiovascular: Normal rate, regular rhythm, normal heart sounds and intact distal pulses. Exam reveals no gallop and no friction rub.  No murmur heard. Pulmonary/Chest: Effort normal and breath sounds normal. No respiratory distress. He has no wheezes. He has no rales. He exhibits no tenderness.  Abdominal: Soft. Bowel sounds are normal. He exhibits no distension and no mass. There is no tenderness. There is no rebound and no guarding.  Genitourinary: Rectum normal, prostate normal and penis normal. Rectal exam shows guaiac  negative stool. No penile tenderness.  Musculoskeletal: Normal range of motion. He exhibits no edema or tenderness.  Lymphadenopathy:    He has no cervical adenopathy.  Neurological: He is alert and oriented to person, place, and time. He has normal reflexes. He displays normal reflexes. No cranial nerve deficit. He exhibits normal muscle tone. Coordination normal.  Skin: Skin is warm and dry. No rash noted. He is not diaphoretic. No erythema. No pallor.  Psychiatric: He has a normal mood and affect. His behavior is normal. Judgment and thought content normal.          Assessment & Plan:  His HTN is stable. We will set up fasting labs to check his lipids and A1c, etc. His LE edema is well controlled. Refer to Ophthalmology for a diabetic eye exam. We wrote for him to get a new wheelchair with a new seat cushion.  Alysia Penna, MD

## 2017-10-25 NOTE — Addendum Note (Signed)
Addended by: Elie Confer on: 10/25/2017 03:40 PM   Modules accepted: Orders

## 2017-10-26 LAB — BASIC METABOLIC PANEL
BUN: 23 mg/dL (ref 6–23)
CHLORIDE: 105 meq/L (ref 96–112)
CO2: 20 meq/L (ref 19–32)
CREATININE: 1.02 mg/dL (ref 0.40–1.50)
Calcium: 8.7 mg/dL (ref 8.4–10.5)
GFR: 77.65 mL/min (ref >60.00)
Glucose, Bld: 119 mg/dL — ABNORMAL HIGH (ref 70–99)
POTASSIUM: 5 meq/L (ref 3.5–5.1)
Sodium: 136 mEq/L (ref 135–145)

## 2017-10-26 LAB — LIPID PANEL
CHOL/HDL RATIO: 2
CHOLESTEROL: 88 mg/dL (ref 0–200)
HDL: 36.5 mg/dL — ABNORMAL LOW (ref >39.00)
LDL CALC: 38 mg/dL (ref 0–99)
NonHDL: 51.73
TRIGLYCERIDES: 67 mg/dL (ref 0.0–149.0)
VLDL: 13.4 mg/dL (ref 0.0–40.0)

## 2017-10-26 LAB — POC URINALSYSI DIPSTICK (AUTOMATED)
BILIRUBIN UA: NEGATIVE
Glucose, UA: NEGATIVE
Ketones, UA: NEGATIVE
LEUKOCYTES UA: NEGATIVE
NITRITE UA: NEGATIVE
PROTEIN UA: NEGATIVE
RBC UA: NEGATIVE
Spec Grav, UA: 1.025 (ref 1.010–1.025)
UROBILINOGEN UA: 0.2 U/dL
pH, UA: 5.5 (ref 5.0–8.0)

## 2017-10-26 LAB — TSH: TSH: 1.02 u[IU]/mL (ref 0.35–4.50)

## 2017-10-26 LAB — HEPATIC FUNCTION PANEL
ALT: 27 U/L (ref 0–53)
AST: 22 U/L (ref 0–37)
Albumin: 4 g/dL (ref 3.5–5.2)
Alkaline Phosphatase: 82 U/L (ref 39–117)
Bilirubin, Direct: 0.3 mg/dL (ref 0.0–0.3)
Total Bilirubin: 1.1 mg/dL (ref 0.2–1.2)
Total Protein: 6.8 g/dL (ref 6.0–8.3)

## 2017-10-26 LAB — CBC WITH DIFFERENTIAL/PLATELET
BASOS ABS: 0 10*3/uL (ref 0.0–0.1)
Basophils Relative: 0.6 % (ref 0.0–3.0)
EOS ABS: 0.3 10*3/uL (ref 0.0–0.7)
Eosinophils Relative: 5.5 % — ABNORMAL HIGH (ref 0.0–5.0)
HCT: 34.1 % — ABNORMAL LOW (ref 39.0–52.0)
Hemoglobin: 11.6 g/dL — ABNORMAL LOW (ref 13.0–17.0)
LYMPHS ABS: 0.8 10*3/uL (ref 0.7–4.0)
Lymphocytes Relative: 15.4 % (ref 12.0–46.0)
MCHC: 33.9 g/dL (ref 30.0–36.0)
MCV: 89.9 fl (ref 78.0–100.0)
MONO ABS: 0.3 10*3/uL (ref 0.1–1.0)
MONOS PCT: 6.5 % (ref 3.0–12.0)
NEUTROS ABS: 3.8 10*3/uL (ref 1.4–7.7)
NEUTROS PCT: 72 % (ref 43.0–77.0)
PLATELETS: 178 10*3/uL (ref 150.0–400.0)
RBC: 3.8 Mil/uL — AB (ref 4.22–5.81)
RDW: 13.3 % (ref 11.5–15.5)
WBC: 5.3 10*3/uL (ref 4.0–10.5)

## 2017-10-26 LAB — HEMOGLOBIN A1C: Hgb A1c MFr Bld: 6.7 % — ABNORMAL HIGH (ref 4.6–6.5)

## 2017-10-26 LAB — PSA: PSA: 0.77 ng/mL (ref 0.10–4.00)

## 2017-10-26 NOTE — Addendum Note (Signed)
Addended by: Rene Kocher on: 10/26/2017 08:56 AM   Modules accepted: Orders

## 2017-11-02 ENCOUNTER — Other Ambulatory Visit: Payer: Self-pay | Admitting: *Deleted

## 2017-11-02 MED ORDER — CIPROFLOXACIN HCL 500 MG PO TABS: 500.0000 mg | ORAL_TABLET | Freq: Two times a day (BID) | ORAL | 0 refills | Status: DC

## 2017-11-09 DIAGNOSIS — H524 Presbyopia: Secondary | ICD-10-CM | POA: Diagnosis not present

## 2017-11-09 DIAGNOSIS — H2513 Age-related nuclear cataract, bilateral: Secondary | ICD-10-CM | POA: Diagnosis not present

## 2017-11-09 DIAGNOSIS — E119 Type 2 diabetes mellitus without complications: Secondary | ICD-10-CM | POA: Diagnosis not present

## 2017-11-09 LAB — HM DIABETES EYE EXAM

## 2018-03-11 ENCOUNTER — Other Ambulatory Visit: Payer: Self-pay | Admitting: Family Medicine

## 2018-06-24 ENCOUNTER — Encounter: Payer: Self-pay | Admitting: Gastroenterology

## 2018-10-24 DIAGNOSIS — H35033 Hypertensive retinopathy, bilateral: Secondary | ICD-10-CM | POA: Diagnosis not present

## 2018-10-24 DIAGNOSIS — H2513 Age-related nuclear cataract, bilateral: Secondary | ICD-10-CM | POA: Diagnosis not present

## 2018-10-24 DIAGNOSIS — H40013 Open angle with borderline findings, low risk, bilateral: Secondary | ICD-10-CM | POA: Diagnosis not present

## 2018-10-24 DIAGNOSIS — E119 Type 2 diabetes mellitus without complications: Secondary | ICD-10-CM | POA: Diagnosis not present

## 2018-11-13 ENCOUNTER — Other Ambulatory Visit: Payer: Self-pay | Admitting: Family Medicine

## 2018-11-14 NOTE — Telephone Encounter (Signed)
Patient need to schedule an ov for more refills.  Called pt no answer.  

## 2018-12-16 ENCOUNTER — Other Ambulatory Visit: Payer: Self-pay

## 2018-12-16 ENCOUNTER — Encounter: Payer: Self-pay | Admitting: Family Medicine

## 2018-12-16 ENCOUNTER — Ambulatory Visit (INDEPENDENT_AMBULATORY_CARE_PROVIDER_SITE_OTHER): Payer: Medicare Other | Admitting: Family Medicine

## 2018-12-16 VITALS — BP 122/62 | HR 58 | Temp 98.3°F

## 2018-12-16 DIAGNOSIS — Z0000 Encounter for general adult medical examination without abnormal findings: Secondary | ICD-10-CM

## 2018-12-16 DIAGNOSIS — Z23 Encounter for immunization: Secondary | ICD-10-CM

## 2018-12-16 DIAGNOSIS — E119 Type 2 diabetes mellitus without complications: Secondary | ICD-10-CM

## 2018-12-16 DIAGNOSIS — R972 Elevated prostate specific antigen [PSA]: Secondary | ICD-10-CM

## 2018-12-16 MED ORDER — HYDROCHLOROTHIAZIDE 25 MG PO TABS: 25.0000 mg | ORAL_TABLET | Freq: Every day | ORAL | 3 refills | Status: DC

## 2018-12-16 MED ORDER — AMLODIPINE BESYLATE 10 MG PO TABS: 10.0000 mg | ORAL_TABLET | Freq: Every day | ORAL | 3 refills | Status: DC

## 2018-12-16 MED ORDER — SOLIFENACIN SUCCINATE 5 MG PO TABS: 5.0000 mg | ORAL_TABLET | Freq: Every day | ORAL | 5 refills | Status: DC

## 2018-12-16 NOTE — Patient Instructions (Addendum)
Health Maintenance Due  Topic Date Due  . Hepatitis C Screening  [birth year]  . FOOT EXAM  09/30/1961  . HEMOGLOBIN A1C  04/26/2018  . COLONOSCOPY  06/26/2018  . OPHTHALMOLOGY EXAM  11/10/2018    Depression screen [facility] 2/9 10/25/2017 08/03/2016  Decreased Interest 0 0  Down, Depressed, Hopeless 0 0  PHQ - 2 Score 0 0
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
## 2018-12-20 ENCOUNTER — Other Ambulatory Visit: Payer: Medicare Other

## 2018-12-20 ENCOUNTER — Other Ambulatory Visit: Payer: Self-pay

## 2018-12-20 LAB — CBC WITH DIFFERENTIAL/PLATELET
Basophils Absolute: 0 10*3/uL (ref 0.0–0.1)
Basophils Relative: 0.5 % (ref 0.0–3.0)
Eosinophils Absolute: 0.4 10*3/uL (ref 0.0–0.7)
Eosinophils Relative: 7.7 % — ABNORMAL HIGH (ref 0.0–5.0)
HCT: 36.6 % — ABNORMAL LOW (ref 39.0–52.0)
Hemoglobin: 12.1 g/dL — ABNORMAL LOW (ref 13.0–17.0)
Lymphocytes Relative: 23.4 % (ref 12.0–46.0)
Lymphs Abs: 1.3 10*3/uL (ref 0.7–4.0)
MCHC: 33 g/dL (ref 30.0–36.0)
MCV: 91.2 fl (ref 78.0–100.0)
Monocytes Absolute: 0.3 10*3/uL (ref 0.1–1.0)
Monocytes Relative: 5.9 % (ref 3.0–12.0)
Neutro Abs: 3.5 10*3/uL (ref 1.4–7.7)
Neutrophils Relative %: 62.5 % (ref 43.0–77.0)
Platelets: 217 10*3/uL (ref 150.0–400.0)
RBC: 4.02 Mil/uL — ABNORMAL LOW (ref 4.22–5.81)
RDW: 13.4 % (ref 11.5–15.5)
WBC: 5.7 10*3/uL (ref 4.0–10.5)

## 2018-12-20 LAB — LIPID PANEL
Cholesterol: 104 mg/dL (ref 0–200)
HDL: 46.1 mg/dL (ref >39.00)
LDL Cholesterol: 43 mg/dL (ref 0–99)
NonHDL: 57.63
Total CHOL/HDL Ratio: 2
Triglycerides: 73 mg/dL (ref 0.0–149.0)
VLDL: 14.6 mg/dL (ref 0.0–40.0)

## 2018-12-20 LAB — BASIC METABOLIC PANEL
BUN: 28 mg/dL — ABNORMAL HIGH (ref 6–23)
CO2: 23 mEq/L (ref 19–32)
Calcium: 8.5 mg/dL (ref 8.4–10.5)
Chloride: 107 mEq/L (ref 96–112)
Creatinine, Ser: 1.16 mg/dL (ref 0.40–1.50)
GFR: 62.76 mL/min (ref >60.00)
Glucose, Bld: 127 mg/dL — ABNORMAL HIGH (ref 70–99)
Potassium: 4.4 mEq/L (ref 3.5–5.1)
Sodium: 136 mEq/L (ref 135–145)

## 2018-12-20 LAB — HEPATIC FUNCTION PANEL
ALT: 16 U/L (ref 0–53)
AST: 17 U/L (ref 0–37)
Albumin: 4.2 g/dL (ref 3.5–5.2)
Alkaline Phosphatase: 91 U/L (ref 39–117)
Bilirubin, Direct: 0.2 mg/dL (ref 0.0–0.3)
Total Bilirubin: 0.8 mg/dL (ref 0.2–1.2)
Total Protein: 6.9 g/dL (ref 6.0–8.3)

## 2018-12-20 LAB — TSH: TSH: 1.03 u[IU]/mL (ref 0.35–4.50)

## 2018-12-20 LAB — HEMOGLOBIN A1C: Hgb A1c MFr Bld: 6.8 % — ABNORMAL HIGH (ref 4.6–6.5)

## 2018-12-20 LAB — PSA: PSA: 4.85 ng/mL — ABNORMAL HIGH (ref 0.10–4.00)

## 2018-12-21 NOTE — Addendum Note (Signed)
Addended by: Alysia Penna A on: 12/21/2018 11:15 AM   Modules accepted: Orders

## 2019-01-23 DIAGNOSIS — R310 Gross hematuria: Secondary | ICD-10-CM | POA: Diagnosis not present

## 2019-01-23 DIAGNOSIS — R972 Elevated prostate specific antigen [PSA]: Secondary | ICD-10-CM | POA: Diagnosis not present

## 2019-02-21 DIAGNOSIS — R3914 Feeling of incomplete bladder emptying: Secondary | ICD-10-CM | POA: Diagnosis not present

## 2019-02-21 DIAGNOSIS — R972 Elevated prostate specific antigen [PSA]: Secondary | ICD-10-CM | POA: Diagnosis not present

## 2019-03-07 DIAGNOSIS — N319 Neuromuscular dysfunction of bladder, unspecified: Secondary | ICD-10-CM | POA: Diagnosis not present

## 2019-03-07 DIAGNOSIS — R339 Retention of urine, unspecified: Secondary | ICD-10-CM | POA: Diagnosis not present

## 2019-03-21 DIAGNOSIS — N319 Neuromuscular dysfunction of bladder, unspecified: Secondary | ICD-10-CM | POA: Diagnosis not present

## 2019-03-21 DIAGNOSIS — R972 Elevated prostate specific antigen [PSA]: Secondary | ICD-10-CM | POA: Diagnosis not present

## 2019-03-21 DIAGNOSIS — R3914 Feeling of incomplete bladder emptying: Secondary | ICD-10-CM | POA: Diagnosis not present

## 2019-03-27 DIAGNOSIS — R339 Retention of urine, unspecified: Secondary | ICD-10-CM | POA: Diagnosis not present

## 2019-03-27 DIAGNOSIS — N319 Neuromuscular dysfunction of bladder, unspecified: Secondary | ICD-10-CM | POA: Diagnosis not present

## 2019-04-17 DIAGNOSIS — R339 Retention of urine, unspecified: Secondary | ICD-10-CM | POA: Diagnosis not present

## 2019-04-17 DIAGNOSIS — N319 Neuromuscular dysfunction of bladder, unspecified: Secondary | ICD-10-CM | POA: Diagnosis not present

## 2019-04-24 DIAGNOSIS — H40013 Open angle with borderline findings, low risk, bilateral: Secondary | ICD-10-CM | POA: Diagnosis not present

## 2019-05-12 DIAGNOSIS — R339 Retention of urine, unspecified: Secondary | ICD-10-CM | POA: Diagnosis not present

## 2019-05-12 DIAGNOSIS — N319 Neuromuscular dysfunction of bladder, unspecified: Secondary | ICD-10-CM | POA: Diagnosis not present

## 2019-05-14 ENCOUNTER — Other Ambulatory Visit: Payer: Self-pay | Admitting: Family Medicine

## 2019-05-25 ENCOUNTER — Other Ambulatory Visit: Payer: Self-pay | Admitting: Family Medicine

## 2019-06-06 ENCOUNTER — Telehealth: Payer: Self-pay | Admitting: Family Medicine

## 2019-06-06 MED ORDER — LISINOPRIL 20 MG PO TABS: ORAL_TABLET | ORAL | 1 refills | Status: DC

## 2019-06-06 MED ORDER — METOPROLOL TARTRATE 100 MG PO TABS: 100.0000 mg | ORAL_TABLET | Freq: Two times a day (BID) | ORAL | 1 refills | Status: DC

## 2019-06-06 NOTE — Addendum Note (Signed)
Addended by: Rebecca Eaton on: 06/06/2019 11:03 AM   Modules accepted: Orders

## 2019-06-06 NOTE — Telephone Encounter (Signed)
Rx sent in. Left a detailed message on verified voice mail.   

## 2019-06-06 NOTE — Telephone Encounter (Signed)
Medication Refill:  Lisinopril  Metoprolol  (pt is out of this)  Pharmacy: [facility]: [phone]

## 2019-06-12 DIAGNOSIS — N319 Neuromuscular dysfunction of bladder, unspecified: Secondary | ICD-10-CM | POA: Diagnosis not present

## 2019-06-12 DIAGNOSIS — R509 Fever, unspecified: Secondary | ICD-10-CM | POA: Diagnosis not present

## 2019-06-12 DIAGNOSIS — R339 Retention of urine, unspecified: Secondary | ICD-10-CM | POA: Diagnosis not present

## 2019-06-12 DIAGNOSIS — R3914 Feeling of incomplete bladder emptying: Secondary | ICD-10-CM | POA: Diagnosis not present

## 2019-06-20 ENCOUNTER — Telehealth: Payer: Self-pay | Admitting: Family Medicine

## 2019-06-20 DIAGNOSIS — R972 Elevated prostate specific antigen [PSA]: Secondary | ICD-10-CM | POA: Diagnosis not present

## 2019-06-20 DIAGNOSIS — N3000 Acute cystitis without hematuria: Secondary | ICD-10-CM | POA: Diagnosis not present

## 2019-06-20 NOTE — Chronic Care Management (AMB) (Signed)
  Chronic Care Management   Outreach Note  06/20/2019 Name: Dwayne Simpson MRN: [id number] DOB: 1950/06/23  Referred by: Laurey Morale, MD Reason for referral : Chronic Care Management   An unsuccessful telephone outreach was attempted today. The patient was referred to the pharmacist for assistance with care management and care coordination.   Follow Up Plan:   [place]

## 2019-07-07 ENCOUNTER — Telehealth: Payer: Self-pay | Admitting: Family Medicine

## 2019-07-07 NOTE — Progress Notes (Signed)
  Chronic Care Management   Outreach Note  07/07/2019 Name: EBERARDO KALLMEYER MRN: [id number] DOB: Mar 20, 1950  Referred by: Laurey Morale, MD Reason for referral : No chief complaint on file.   A second unsuccessful telephone outreach was attempted today. The patient was referred to pharmacist for assistance with care management and care coordination.  This note is not being shared with the patient for the following reason: To respect privacy (The patient or proxy has requested that the information not be shared).   Follow Up Plan:   [place]

## 2019-07-11 DIAGNOSIS — N319 Neuromuscular dysfunction of bladder, unspecified: Secondary | ICD-10-CM | POA: Diagnosis not present

## 2019-07-11 DIAGNOSIS — R339 Retention of urine, unspecified: Secondary | ICD-10-CM | POA: Diagnosis not present

## 2019-07-26 ENCOUNTER — Telehealth: Payer: Self-pay | Admitting: Family Medicine

## 2019-07-26 NOTE — Progress Notes (Signed)
  Chronic Care Management   Outreach Note  07/26/2019 Name: Dwayne Simpson MRN: [id number] DOB: [birth year]  Referred by: Laurey Morale, MD Reason for referral : No chief complaint on file.   Third unsuccessful telephone outreach was attempted today. The patient was referred to the pharmacist for assistance with care management and care coordination.   Follow Up Plan:   [place]

## 2019-08-10 DIAGNOSIS — N319 Neuromuscular dysfunction of bladder, unspecified: Secondary | ICD-10-CM | POA: Diagnosis not present

## 2019-08-10 DIAGNOSIS — R339 Retention of urine, unspecified: Secondary | ICD-10-CM | POA: Diagnosis not present

## 2019-09-05 DIAGNOSIS — R339 Retention of urine, unspecified: Secondary | ICD-10-CM | POA: Diagnosis not present

## 2019-09-05 DIAGNOSIS — N319 Neuromuscular dysfunction of bladder, unspecified: Secondary | ICD-10-CM | POA: Diagnosis not present

## 2019-09-18 DIAGNOSIS — R3914 Feeling of incomplete bladder emptying: Secondary | ICD-10-CM | POA: Diagnosis not present

## 2019-09-18 DIAGNOSIS — R972 Elevated prostate specific antigen [PSA]: Secondary | ICD-10-CM | POA: Diagnosis not present

## 2019-09-18 DIAGNOSIS — N319 Neuromuscular dysfunction of bladder, unspecified: Secondary | ICD-10-CM | POA: Diagnosis not present

## 2019-10-03 DIAGNOSIS — R339 Retention of urine, unspecified: Secondary | ICD-10-CM | POA: Diagnosis not present

## 2019-10-03 DIAGNOSIS — N319 Neuromuscular dysfunction of bladder, unspecified: Secondary | ICD-10-CM | POA: Diagnosis not present

## 2019-10-26 DIAGNOSIS — R339 Retention of urine, unspecified: Secondary | ICD-10-CM | POA: Diagnosis not present

## 2019-10-26 DIAGNOSIS — N319 Neuromuscular dysfunction of bladder, unspecified: Secondary | ICD-10-CM | POA: Diagnosis not present

## 2019-11-03 DIAGNOSIS — H35033 Hypertensive retinopathy, bilateral: Secondary | ICD-10-CM | POA: Diagnosis not present

## 2019-11-03 DIAGNOSIS — H2513 Age-related nuclear cataract, bilateral: Secondary | ICD-10-CM | POA: Diagnosis not present

## 2019-11-03 DIAGNOSIS — H40013 Open angle with borderline findings, low risk, bilateral: Secondary | ICD-10-CM | POA: Diagnosis not present

## 2019-11-03 DIAGNOSIS — E119 Type 2 diabetes mellitus without complications: Secondary | ICD-10-CM | POA: Diagnosis not present

## 2019-11-03 LAB — HM DIABETES EYE EXAM

## 2019-11-10 ENCOUNTER — Other Ambulatory Visit: Payer: Self-pay | Admitting: Family Medicine

## 2019-11-22 ENCOUNTER — Other Ambulatory Visit: Payer: Self-pay | Admitting: Family Medicine

## 2019-11-22 DIAGNOSIS — N319 Neuromuscular dysfunction of bladder, unspecified: Secondary | ICD-10-CM | POA: Diagnosis not present

## 2019-11-22 DIAGNOSIS — R339 Retention of urine, unspecified: Secondary | ICD-10-CM | POA: Diagnosis not present

## 2019-12-11 ENCOUNTER — Other Ambulatory Visit: Payer: Self-pay | Admitting: Family Medicine

## 2019-12-14 DIAGNOSIS — R339 Retention of urine, unspecified: Secondary | ICD-10-CM | POA: Diagnosis not present

## 2019-12-14 DIAGNOSIS — N319 Neuromuscular dysfunction of bladder, unspecified: Secondary | ICD-10-CM | POA: Diagnosis not present

## 2020-01-08 ENCOUNTER — Other Ambulatory Visit: Payer: Self-pay | Admitting: Family Medicine

## 2020-01-10 DIAGNOSIS — R339 Retention of urine, unspecified: Secondary | ICD-10-CM | POA: Diagnosis not present

## 2020-01-10 DIAGNOSIS — N319 Neuromuscular dysfunction of bladder, unspecified: Secondary | ICD-10-CM | POA: Diagnosis not present

## 2020-02-12 ENCOUNTER — Other Ambulatory Visit: Payer: Self-pay | Admitting: Family Medicine

## 2020-02-19 DIAGNOSIS — R339 Retention of urine, unspecified: Secondary | ICD-10-CM | POA: Diagnosis not present

## 2020-02-19 DIAGNOSIS — N319 Neuromuscular dysfunction of bladder, unspecified: Secondary | ICD-10-CM | POA: Diagnosis not present

## 2020-02-22 ENCOUNTER — Ambulatory Visit (INDEPENDENT_AMBULATORY_CARE_PROVIDER_SITE_OTHER): Payer: Medicare Other | Admitting: Family Medicine

## 2020-02-22 ENCOUNTER — Other Ambulatory Visit: Payer: Self-pay

## 2020-02-22 ENCOUNTER — Encounter: Payer: Self-pay | Admitting: Family Medicine

## 2020-02-22 VITALS — BP 138/74 | HR 53 | Ht 72.0 in

## 2020-02-22 DIAGNOSIS — E119 Type 2 diabetes mellitus without complications: Secondary | ICD-10-CM | POA: Diagnosis not present

## 2020-02-22 DIAGNOSIS — Z0000 Encounter for general adult medical examination without abnormal findings: Secondary | ICD-10-CM

## 2020-02-22 DIAGNOSIS — I10 Essential (primary) hypertension: Secondary | ICD-10-CM

## 2020-02-22 DIAGNOSIS — E782 Mixed hyperlipidemia: Secondary | ICD-10-CM | POA: Diagnosis not present

## 2020-02-22 DIAGNOSIS — T1490XS Injury, unspecified, sequela: Secondary | ICD-10-CM

## 2020-02-22 DIAGNOSIS — R609 Edema, unspecified: Secondary | ICD-10-CM | POA: Diagnosis not present

## 2020-02-22 LAB — CBC WITH DIFFERENTIAL/PLATELET
Basophils Absolute: 0 10*3/uL (ref 0.0–0.1)
Basophils Relative: 0.5 % (ref 0.0–3.0)
Eosinophils Absolute: 0.3 10*3/uL (ref 0.0–0.7)
Eosinophils Relative: 5.7 % — ABNORMAL HIGH (ref 0.0–5.0)
HCT: 37.7 % — ABNORMAL LOW (ref 39.0–52.0)
Hemoglobin: 12.5 g/dL — ABNORMAL LOW (ref 13.0–17.0)
Lymphocytes Relative: 29 % (ref 12.0–46.0)
Lymphs Abs: 1.4 10*3/uL (ref 0.7–4.0)
MCHC: 33.3 g/dL (ref 30.0–36.0)
MCV: 88.4 fl (ref 78.0–100.0)
Monocytes Absolute: 0.5 10*3/uL (ref 0.1–1.0)
Monocytes Relative: 10.2 % (ref 3.0–12.0)
Neutro Abs: 2.6 10*3/uL (ref 1.4–7.7)
Neutrophils Relative %: 54.6 % (ref 43.0–77.0)
Platelets: 211 10*3/uL (ref 150.0–400.0)
RBC: 4.26 Mil/uL (ref 4.22–5.81)
RDW: 14 % (ref 11.5–15.5)
WBC: 4.8 10*3/uL (ref 4.0–10.5)

## 2020-02-22 LAB — BASIC METABOLIC PANEL
BUN: 25 mg/dL — ABNORMAL HIGH (ref 6–23)
CO2: 21 mEq/L (ref 19–32)
Calcium: 8.8 mg/dL (ref 8.4–10.5)
Chloride: 105 mEq/L (ref 96–112)
Creatinine, Ser: 1.1 mg/dL (ref 0.40–1.50)
GFR: 69.03 mL/min (ref >60.00)
Glucose, Bld: 81 mg/dL (ref 70–99)
Potassium: 4.2 mEq/L (ref 3.5–5.1)
Sodium: 133 mEq/L — ABNORMAL LOW (ref 135–145)

## 2020-02-22 LAB — HEPATIC FUNCTION PANEL
ALT: 17 U/L (ref 0–53)
AST: 18 U/L (ref 0–37)
Albumin: 4.2 g/dL (ref 3.5–5.2)
Alkaline Phosphatase: 83 U/L (ref 39–117)
Bilirubin, Direct: 0.2 mg/dL (ref 0.0–0.3)
Total Bilirubin: 0.9 mg/dL (ref 0.2–1.2)
Total Protein: 7.2 g/dL (ref 6.0–8.3)

## 2020-02-22 LAB — LIPID PANEL
Cholesterol: 137 mg/dL (ref 0–200)
HDL: 44.5 mg/dL (ref >39.00)
LDL Cholesterol: 68 mg/dL (ref 0–99)
NonHDL: 92.27
Total CHOL/HDL Ratio: 3
Triglycerides: 119 mg/dL (ref 0.0–149.0)
VLDL: 23.8 mg/dL (ref 0.0–40.0)

## 2020-02-22 LAB — HEMOGLOBIN A1C: Hgb A1c MFr Bld: 6.8 % — ABNORMAL HIGH (ref 4.6–6.5)

## 2020-02-22 LAB — TSH: TSH: 1.38 u[IU]/mL (ref 0.35–4.50)

## 2020-02-22 MED ORDER — METFORMIN HCL 500 MG PO TABS: ORAL_TABLET | ORAL | 3 refills | Status: DC

## 2020-02-22 MED ORDER — ATORVASTATIN CALCIUM 20 MG PO TABS
20.0000 mg | ORAL_TABLET | Freq: Every day | ORAL | 3 refills | Status: DC
Start: 2020-02-22 — End: 2021-02-12

## 2020-02-22 NOTE — Progress Notes (Signed)
Subjective:    Patient ID: Dwayne Simpson, male    DOB: 12-12-1950, 70 y.o.   MRN: [id number]  HPI Here to follow up on issues. He feels well. He sees Dr. Louis Meckel every 6 months to follow elevated PSA readings. This was down to 4.09 last August. His diabetes is stable.His BP is stable. His leg edema is well controlled. He retired and he enjoys Civil engineer, contracting.    Review of Systems  Constitutional: Negative.   HENT: Negative.   Eyes: Negative.   Respiratory: Negative.   Cardiovascular: Negative.   Gastrointestinal: Negative.   Genitourinary: Negative.   Musculoskeletal: Positive for gait problem.  Skin: Negative.   Psychiatric/Behavioral: Negative.        Objective:   Physical Exam Constitutional:      General: He is not in acute distress.    Appearance: He is well-developed and well-nourished. He is not diaphoretic.     Comments: In his wheelchair   HENT:     Head: Normocephalic and atraumatic.     Right Ear: External ear normal.     Left Ear: External ear normal.     Nose: Nose normal.     Mouth/Throat:     Mouth: Oropharynx is clear and moist.     Pharynx: No oropharyngeal exudate.  Eyes:     General: No scleral icterus.       Right eye: No discharge.        Left eye: No discharge.     Extraocular Movements: EOM normal.     Conjunctiva/sclera: Conjunctivae normal.     Pupils: Pupils are equal, round, and reactive to light.  Neck:     Thyroid: No thyromegaly.     Vascular: No JVD.     Trachea: No tracheal deviation.  Cardiovascular:     Rate and Rhythm: Normal rate and regular rhythm.     Pulses: Intact distal pulses.     Heart sounds: Normal heart sounds. No murmur heard. No friction rub. No gallop.   Pulmonary:     Effort: Pulmonary effort is normal. No respiratory distress.     Breath sounds: Normal breath sounds. No wheezing or rales.  Chest:     Chest wall: No tenderness.  Abdominal:     General: Bowel sounds are normal. There is no distension.      Palpations: Abdomen is soft. There is no mass.     Tenderness: There is no abdominal tenderness. There is no guarding or rebound.  Genitourinary:    Penis: Normal. No tenderness.      Prostate: Normal.     Rectum: Normal. Guaiac result negative.  Musculoskeletal:        General: No tenderness or edema. Normal range of motion.     Cervical back: Neck supple.  Lymphadenopathy:     Cervical: No cervical adenopathy.  Skin:    General: Skin is warm and dry.     Coloration: Skin is not pale.     Findings: No erythema or rash.  Neurological:     Mental Status: He is alert and oriented to person, place, and time.     Cranial Nerves: No cranial nerve deficit.     Motor: No abnormal muscle tone.     Coordination: Coordination normal.     Deep Tendon Reflexes: Reflexes are normal and symmetric. Reflexes normal.  Psychiatric:        Mood and Affect: Mood and affect normal.  Behavior: Behavior normal.        Thought Content: Thought content normal.        Judgment: Judgment normal.           Assessment & Plan:  His HTN is stable. He will follow up with Urology for elevated PSA's. We will get fasting labs to check lipids, an A1c, etc.  Alysia Penna, MD

## 2020-03-12 DIAGNOSIS — R339 Retention of urine, unspecified: Secondary | ICD-10-CM | POA: Diagnosis not present

## 2020-03-12 DIAGNOSIS — N319 Neuromuscular dysfunction of bladder, unspecified: Secondary | ICD-10-CM | POA: Diagnosis not present

## 2020-03-26 DIAGNOSIS — R972 Elevated prostate specific antigen [PSA]: Secondary | ICD-10-CM | POA: Diagnosis not present

## 2020-04-02 DIAGNOSIS — N319 Neuromuscular dysfunction of bladder, unspecified: Secondary | ICD-10-CM | POA: Diagnosis not present

## 2020-04-02 DIAGNOSIS — R3914 Feeling of incomplete bladder emptying: Secondary | ICD-10-CM | POA: Diagnosis not present

## 2020-04-09 DIAGNOSIS — N319 Neuromuscular dysfunction of bladder, unspecified: Secondary | ICD-10-CM | POA: Diagnosis not present

## 2020-04-09 DIAGNOSIS — R339 Retention of urine, unspecified: Secondary | ICD-10-CM | POA: Diagnosis not present

## 2020-04-30 ENCOUNTER — Telehealth: Payer: Self-pay | Admitting: Gastroenterology

## 2020-04-30 NOTE — Telephone Encounter (Signed)
Spoke with pt and was informed his last colonoscopy was done in 2010 and not 5-6 years ago, pt will call back in April to schedule a morning appt for June

## 2020-05-11 DIAGNOSIS — R339 Retention of urine, unspecified: Secondary | ICD-10-CM | POA: Diagnosis not present

## 2020-05-11 DIAGNOSIS — N319 Neuromuscular dysfunction of bladder, unspecified: Secondary | ICD-10-CM | POA: Diagnosis not present

## 2020-05-14 ENCOUNTER — Other Ambulatory Visit: Payer: Self-pay | Admitting: Family Medicine

## 2020-05-27 ENCOUNTER — Encounter: Payer: Medicare Other | Admitting: Gastroenterology

## 2020-05-31 ENCOUNTER — Ambulatory Visit: Payer: Medicare Other

## 2020-06-04 ENCOUNTER — Other Ambulatory Visit: Payer: Self-pay | Admitting: Family Medicine

## 2020-06-10 DIAGNOSIS — N319 Neuromuscular dysfunction of bladder, unspecified: Secondary | ICD-10-CM | POA: Diagnosis not present

## 2020-06-10 DIAGNOSIS — R339 Retention of urine, unspecified: Secondary | ICD-10-CM | POA: Diagnosis not present

## 2020-06-18 ENCOUNTER — Ambulatory Visit (INDEPENDENT_AMBULATORY_CARE_PROVIDER_SITE_OTHER): Payer: Medicare Other

## 2020-06-18 DIAGNOSIS — Z0000 Encounter for general adult medical examination without abnormal findings: Secondary | ICD-10-CM | POA: Diagnosis not present

## 2020-06-18 DIAGNOSIS — Z1211 Encounter for screening for malignant neoplasm of colon: Secondary | ICD-10-CM

## 2020-06-18 NOTE — Progress Notes (Signed)
Subjective:   Dwayne Simpson is a 70 y.o. male who presents for an Initial Medicare Annual Wellness Visit.  Virtual Visit via Video Note  I was unable to connected with Norva Riffle  by a video enabled telemedicine application.  Called pt who said he was unable to connect . I verbally verified that I am speaking with the correct person using two identifiers.   Location: Patient: Home Provider: Office Persons participating in the virtual visit: patient, provider   I discussed the limitations of evaluation and management by telemedicine and the availability of in person appointments. The patient expressed understanding and agreed to proceed.     Mickel Baas Malashia Kamaka,LPN    Review of Systems    N/A       Objective:    There were no vitals filed for this visit. There is no height or weight on file to calculate BMI.  No flowsheet data found.  Current Medications (verified) Outpatient Encounter Medications as of 06/18/2020  Medication Sig  . ACCU-CHEK FASTCLIX LANCETS MISC USE AS DIRECTED TO TEST BLOOD SUGAR DAILY  . amLODipine (NORVASC) 10 MG tablet TAKE 1 TABLET(10 MG) BY MOUTH DAILY  . aspirin 81 MG tablet Take 1 tablet (81 mg total) by mouth daily. Resume in 48hrs.  Marland Kitchen atorvastatin (LIPITOR) 20 MG tablet Take 1 tablet (20 mg total) by mouth daily.  . ciprofloxacin (CIPRO) 500 MG tablet Take 1 tablet (500 mg total) by mouth 2 (two) times daily.  Marland Kitchen glipiZIDE (GLUCOTROL) 10 MG tablet TAKE 1 TABLET(10 MG) BY MOUTH TWICE DAILY BEFORE A MEAL  . glucose blood (ACCU-CHEK AVIVA PLUS) test strip TEST ONE EVERY DAY AS DIRECTED  . halobetasol (ULTRAVATE) 0.05 % ointment Apply topically 2 (two) times daily.  . hydrochlorothiazide (HYDRODIURIL) 25 MG tablet TAKE 1 TABLET(25 MG) BY MOUTH DAILY  . lisinopril (ZESTRIL) 20 MG tablet TAKE 1 TABLET(20 MG) BY MOUTH DAILY  . metFORMIN (GLUCOPHAGE) 500 MG tablet TAKE 1 TABLET(500 MG) BY MOUTH TWICE DAILY WITH A MEAL  . metoprolol tartrate (LOPRESSOR) 100  MG tablet TAKE 1 TABLET(100 MG) BY MOUTH TWICE DAILY  . pioglitazone (ACTOS) 15 MG tablet TAKE 1 TABLET(15 MG) BY MOUTH DAILY  . solifenacin (VESICARE) 5 MG tablet Take 1 tablet (5 mg total) by mouth daily.   No facility-administered encounter medications on file as of 06/18/2020.    Allergies (verified) Patient has no known allergies.   History: Past Medical History:  Diagnosis Date  . Allergic rhinitis   . Arthritis   . History of nephrolithiasis   . Hyperlipidemia   . Hyperlipidemia   . Hypertension   . Paraplegia ([place])    from pelvic down--  stands w/ assist and transfers self  . Right hydrocele   . T12 spinal cord injury ([place])    [place]  . Type 2 diabetes mellitus ([place])   . Wears glasses   . Wheelchair bound    Past Surgical History:  Procedure Laterality Date  . COLONOSCOPY  06/25/08   per Dr. Ardis Hughs benign polyp repeat in 10 years  . HYDROCELE EXCISION Right 03/29/2013   Procedure: RIGHT HYDROCELECTOMY ADULT;  Surgeon: Ardis Hughs, MD;  Location: [facility];  Service: Urology;  Laterality: Right;  . INTERNAL AND EXTERNAL HEMORRHOIDECTOMY  07-31-2008  . lysis of the spinal cord  1980   at T12 injury fell out of  tree    Family History  Problem Relation Age of Onset  .  Coronary artery disease Other   . Diabetes Other   . Hypertension Other   . Liver disease Other   . Stroke Other   . Thyroid disease Other    Social History   Socioeconomic History  . Marital status: Married    Spouse name: Not on file  . Number of children: Not on file  . Years of education: Not on file  . Highest education level: Not on file  Occupational History  . Not on file  Tobacco Use  . Smoking status: Former Smoker    Types: Cigarettes    Quit date: 03/22/1998    Years since quitting: 22.2  . Smokeless tobacco: Never Used  Substance and Sexual Activity  . Alcohol use: Yes    Alcohol/week: 0.0 standard drinks    Comment: rare  . Drug  use: No  . Sexual activity: Not on file  Other Topics Concern  . Not on file  Social History Narrative   Married         Social Determinants of Health   Financial Resource Strain: Not on file  Food Insecurity: Not on file  Transportation Needs: Not on file  Physical Activity: Not on file  Stress: Not on file  Social Connections: Not on file    Tobacco Counseling Counseling given: Not Answered   Clinical Intake:                 Diabetic?yes Nutrition Risk Assessment:  Has the patient had any N/V/D within the last 2 months?  No  Does the patient have any non-healing wounds?  No  Has the patient had any unintentional weight loss or weight gain?  No   Diabetes:  Is the patient diabetic?  Yes  If diabetic, was a CBG obtained today?  No  Did the patient bring in their glucometer from home?  No  How often do you monitor your CBG's? Daily .   Financial Strains and Diabetes Management:  Are you having any financial strains with the device, your supplies or your medication? No .  Does the patient want to be seen by Chronic Care Management for management of their diabetes?  No  Would the patient like to be referred to a Nutritionist or for Diabetic Management?  No   Diabetic Exams:  Diabetic Eye Exam: Completed Dr.Harrick  Diabetic Foot Exam: Overdue, Pt has been advised about the importance in completing this exam. Pt is scheduled for diabetic foot exam on next office visit .          Activities of Daily Living No flowsheet data found.  Patient Care Team: Laurey Morale, MD as PCP - General  Indicate any recent Medical Services you may have received from other than [facility] providers in the past year (date may be approximate).     Assessment:   This is a routine wellness examination for Dwayne Simpson.  Hearing/Vision screen No exam data present  Dietary issues and exercise activities discussed:    Goals Addressed   None    Depression Screen PHQ 2/9  Scores 10/25/2017 08/03/2016  PHQ - 2 Score 0 0    Fall Risk Fall Risk  10/25/2017 08/03/2016  Falls in the past year? No No    FALL RISK PREVENTION PERTAINING TO THE HOME:  Any stairs in or around the home? Yes  If so, are there any without handrails? No  Home free of loose throw rugs in walkways, pet beds, electrical cords, etc? Yes  Adequate lighting  in your home to reduce risk of falls? Yes   ASSISTIVE DEVICES UTILIZED TO PREVENT FALLS:  Life alert? No  Use of a cane, walker or w/c? Yes  Grab bars in the bathroom? Yes  Shower chair or bench in shower? Yes  Elevated toilet seat or a handicapped toilet? Yes    Cognitive Function:       Normal cognitive status assessed by direct observation by this Nurse Health Advisor. No abnormalities found.    Immunizations Immunization History  Administered Date(s) Administered  . Fluad Quad(high Dose 65+) 12/16/2018, 02/01/2020  . Influenza Split 11/26/2010, 12/16/2011  . Influenza Whole 11/07/2007  . Influenza, High Dose Seasonal PF 10/09/2016, 10/25/2017  . Influenza,inj,Quad PF,6+ Mos 02/01/2013, 10/24/2014  . PFIZER(Purple Top)SARS-COV-2 Vaccination 05/18/2019, 02/01/2020  . Pneumococcal Conjugate-13 10/25/2017  . Pneumococcal Polysaccharide-23 12/16/2018  . Tdap 01/01/2013    TDAP status: Up to date  Flu Vaccine status: Up to date  Pneumococcal vaccine status: Up to date  Covid-19 vaccine status: Completed vaccines  Qualifies for Shingles Vaccine? Yes   Zostavax completed No   Shingrix Completed?: No.    Education has been provided regarding the importance of this vaccine. Patient has been advised to call insurance company to determine out of pocket expense if they have not yet received this vaccine. Advised may also receive vaccine at local pharmacy or Health Dept. Verbalized acceptance and understanding.  Screening Tests Health Maintenance  Topic Date Due  . FOOT EXAM  Never done  . Hepatitis C Screening  Never  done  . COLONOSCOPY (Pts 45-103yrs Insurance coverage will need to be confirmed)  06/26/2018  . COVID-19 Vaccine (3 - Booster for Pfizer series) 08/01/2020  . HEMOGLOBIN A1C  08/21/2020  . INFLUENZA VACCINE  09/09/2020  . OPHTHALMOLOGY EXAM  11/02/2020  . TETANUS/TDAP  01/02/2023  . PNA vac Low Risk Adult  Completed  . HPV VACCINES  Aged Out    Health Maintenance  Health Maintenance Due  Topic Date Due  . FOOT EXAM  Never done  . Hepatitis C Screening  Never done  . COLONOSCOPY (Pts 45-39yrs Insurance coverage will need to be confirmed)  06/26/2018    Colorectal cancer screening: Referral to GI placed 06/18/2020. Pt aware the office will call re: appt.  Lung Cancer Screening: (Low Dose CT Chest recommended if Age 3-80 years, 30 pack-year currently smoking OR have quit w/in 15years.) does not qualify.   Lung Cancer Screening Referral: n/a  Additional Screening:  Hepatitis C Screening: does qualify  Vision Screening: Recommended annual ophthalmology exams for early detection of glaucoma and other disorders of the eye. Is the patient up to date with their annual eye exam?  Yes  Who is the provider or what is the name of the office in which the patient attends annual eye exams? Dr.Harrick  If pt is not established with a provider, would they like to be referred to a provider to establish care? Yes .   Dental Screening: Recommended annual dental exams for proper oral hygiene  Community Resource Referral / Chronic Care Management: CRR required this visit?  No   CCM required this visit?  No      Plan:     I have personally reviewed and noted the following in the patient's chart:   . Medical and social history . Use of alcohol, tobacco or illicit drugs  . Current medications and supplements including opioid prescriptions. Patient is not currently taking opioid prescriptions. . Functional ability and status .  Nutritional status . Physical activity . Advanced  directives . List of other physicians . Hospitalizations, surgeries, and ER visits in previous 12 months . Vitals . Screenings to include cognitive, depression, and falls . Referrals and appointments  In addition, I have reviewed and discussed with patient certain preventive protocols, quality metrics, and best practice recommendations. A written personalized care plan for preventive services as well as general preventive health recommendations were provided to patient.     Randel Pigg, LPN   1/61/0960   Nurse Notes: none

## 2020-06-18 NOTE — Patient Instructions (Signed)
Dwayne Simpson , Thank you for taking time to come for your Medicare Wellness Visit. I appreciate your ongoing commitment to your health goals. Please review the following plan we discussed and let me know if I can assist you in the future.   Screening recommendations/referrals: Colonoscopy: referral completed 06/18/2020 Recommended yearly ophthalmology/optometry visit for glaucoma screening and checkup Recommended yearly dental visit for hygiene and checkup  Vaccinations: Influenza vaccine: due in fall 2022 Pneumococcal vaccine: completed series  Tdap vaccine: current due 2024 Shingles vaccine: pt will obtain   Advanced directives: will provide copies   Conditions/risks identified: none   Next appointment: 0520/2022  At 100pm   Dr. Sarajane Jews   Preventive Care 4 Years and Older, Male Preventive care refers to lifestyle choices and visits with your health care provider that can promote health and wellness. What does preventive care include?  A yearly physical exam. This is also called an annual well check.  Dental exams once or twice a year.  Routine eye exams. Ask your health care provider how often you should have your eyes checked.  Personal lifestyle choices, including:  Daily care of your teeth and gums.  Regular physical activity.  Eating a healthy diet.  Avoiding tobacco and drug use.  Limiting alcohol use.  Practicing safe sex.  Taking low doses of aspirin every day.  Taking vitamin and mineral supplements as recommended by your health care provider. What happens during an annual well check? The services and screenings done by your health care provider during your annual well check will depend on your age, overall health, lifestyle risk factors, and family history of disease. Counseling  Your health care provider may ask you questions about your:  Alcohol use.  Tobacco use.  Drug use.  Emotional well-being.  Home and relationship well-being.  Sexual  activity.  Eating habits.  History of falls.  Memory and ability to understand (cognition).  Work and work Statistician. Screening  You may have the following tests or measurements:  Height, weight, and BMI.  Blood pressure.  Lipid and cholesterol levels. These may be checked every 5 years, or more frequently if you are over 74 years old.  Skin check.  Lung cancer screening. You may have this screening every year starting at age 40 if you have a 30-pack-year history of smoking and currently smoke or have quit within the past 15 years.  Fecal occult blood test (FOBT) of the stool. You may have this test every year starting at age 107.  Flexible sigmoidoscopy or colonoscopy. You may have a sigmoidoscopy every 5 years or a colonoscopy every 10 years starting at age 35.  Prostate cancer screening. Recommendations will vary depending on your family history and other risks.  Hepatitis C blood test.  Hepatitis B blood test.  Sexually transmitted disease (STD) testing.  Diabetes screening. This is done by checking your blood sugar (glucose) after you have not eaten for a while (fasting). You may have this done every 1-3 years.  Abdominal aortic aneurysm (AAA) screening. You may need this if you are a current or former smoker.  Osteoporosis. You may be screened starting at age 61 if you are at high risk. Talk with your health care provider about your test results, treatment options, and if necessary, the need for more tests. Vaccines  Your health care provider may recommend certain vaccines, such as:  Influenza vaccine. This is recommended every year.  Tetanus, diphtheria, and acellular pertussis (Tdap, Td) vaccine. You may need a Td  booster every 10 years.  Zoster vaccine. You may need this after age 55.  Pneumococcal 13-valent conjugate (PCV13) vaccine. One dose is recommended after age 47.  Pneumococcal polysaccharide (PPSV23) vaccine. One dose is recommended after age  39. Talk to your health care provider about which screenings and vaccines you need and how often you need them. This information is not intended to replace advice given to you by your health care provider. Make sure you discuss any questions you have with your health care provider. Document Released: 02/22/2015 Document Revised: 10/16/2015 Document Reviewed: 11/27/2014 Elsevier Interactive Patient Education  2017 [employer] Prevention in the Home Falls can cause injuries. They can happen to people of all ages. There are many things you can do to make your home safe and to help prevent falls. What can I do on the outside of my home?  Regularly fix the edges of walkways and driveways and fix any cracks.  Remove anything that might make you trip as you walk through a door, such as a raised step or threshold.  Trim any bushes or trees on the path to your home.  Use bright outdoor lighting.  Clear any walking paths of anything that might make someone trip, such as rocks or tools.  Regularly check to see if handrails are loose or broken. Make sure that both sides of any steps have handrails.  Any raised decks and porches should have guardrails on the edges.  Have any leaves, snow, or ice cleared regularly.  Use sand or salt on walking paths during winter.  Clean up any spills in your garage right away. This includes oil or grease spills. What can I do in the bathroom?  Use night lights.  Install grab bars by the toilet and in the tub and shower. Do not use towel bars as grab bars.  Use non-skid mats or decals in the tub or shower.  If you need to sit down in the shower, use a plastic, non-slip stool.  Keep the floor dry. Clean up any water that spills on the floor as soon as it happens.  Remove soap buildup in the tub or shower regularly.  Attach bath mats securely with double-sided non-slip rug tape.  Do not have throw rugs and other things on the floor that can make  you trip. What can I do in the bedroom?  Use night lights.  Make sure that you have a light by your bed that is easy to reach.  Do not use any sheets or blankets that are too big for your bed. They should not hang down onto the floor.  Have a firm chair that has side arms. You can use this for support while you get dressed.  Do not have throw rugs and other things on the floor that can make you trip. What can I do in the kitchen?  Clean up any spills right away.  Avoid walking on wet floors.  Keep items that you use a lot in easy-to-reach places.  If you need to reach something above you, use a strong step stool that has a grab bar.  Keep electrical cords out of the way.  Do not use floor polish or wax that makes floors slippery. If you must use wax, use non-skid floor wax.  Do not have throw rugs and other things on the floor that can make you trip. What can I do with my stairs?  Do not leave any items on the stairs.  Make sure that there are handrails on both sides of the stairs and use them. Fix handrails that are broken or loose. Make sure that handrails are as long as the stairways.  Check any carpeting to make sure that it is firmly attached to the stairs. Fix any carpet that is loose or worn.  Avoid having throw rugs at the top or bottom of the stairs. If you do have throw rugs, attach them to the floor with carpet tape.  Make sure that you have a light switch at the top of the stairs and the bottom of the stairs. If you do not have them, ask someone to add them for you. What else can I do to help prevent falls?  Wear shoes that:  Do not have high heels.  Have rubber bottoms.  Are comfortable and fit you well.  Are closed at the toe. Do not wear sandals.  If you use a stepladder:  Make sure that it is fully opened. Do not climb a closed stepladder.  Make sure that both sides of the stepladder are locked into place.  Ask someone to hold it for you, if  possible.  Clearly mark and make sure that you can see:  Any grab bars or handrails.  First and last steps.  Where the edge of each step is.  Use tools that help you move around (mobility aids) if they are needed. These include:  Canes.  Walkers.  Scooters.  Crutches.  Turn on the lights when you go into a dark area. Replace any light bulbs as soon as they burn out.  Set up your furniture so you have a clear path. Avoid moving your furniture around.  If any of your floors are uneven, fix them.  If there are any pets around you, be aware of where they are.  Review your medicines with your doctor. Some medicines can make you feel dizzy. This can increase your chance of falling. Ask your doctor what other things that you can do to help prevent falls. This information is not intended to replace advice given to you by your health care provider. Make sure you discuss any questions you have with your health care provider. Document Released: 11/22/2008 Document Revised: 07/04/2015 Document Reviewed: 03/02/2014 Elsevier Interactive Patient Education  2017 [employer].

## 2020-06-27 ENCOUNTER — Other Ambulatory Visit: Payer: Self-pay

## 2020-06-28 ENCOUNTER — Encounter: Payer: Self-pay | Admitting: Family Medicine

## 2020-06-28 ENCOUNTER — Ambulatory Visit (INDEPENDENT_AMBULATORY_CARE_PROVIDER_SITE_OTHER): Payer: Medicare Other | Admitting: Family Medicine

## 2020-06-28 VITALS — BP 128/78 | HR 55 | Temp 98.2°F | Wt 222.0 lb

## 2020-06-28 DIAGNOSIS — M19042 Primary osteoarthritis, left hand: Secondary | ICD-10-CM | POA: Diagnosis not present

## 2020-06-28 DIAGNOSIS — M19041 Primary osteoarthritis, right hand: Secondary | ICD-10-CM | POA: Diagnosis not present

## 2020-06-28 DIAGNOSIS — M1990 Unspecified osteoarthritis, unspecified site: Secondary | ICD-10-CM | POA: Insufficient documentation

## 2020-06-28 NOTE — Progress Notes (Signed)
   Subjective:    Patient ID: Dwayne Simpson, male    DOB: Sep 12, 1950, 70 y.o.   MRN: [id number]  HPI Here for arthritis pain in both hands. He uses them a lot for gardening and painting. He takes Tylenol and uses capsaicin cream with mixed success.    Review of Systems  Constitutional: Negative.   Respiratory: Negative.   Cardiovascular: Negative.   Musculoskeletal: Positive for arthralgias.       Objective:   Physical Exam Constitutional:      Appearance: Normal appearance.  Cardiovascular:     Rate and Rhythm: Normal rate and regular rhythm.     Pulses: Normal pulses.     Heart sounds: Normal heart sounds.  Pulmonary:     Effort: Pulmonary effort is normal.     Breath sounds: Normal breath sounds.  Musculoskeletal:     Comments: Both hands are tender, primarily at both first [facility] joints   Neurological:     Mental Status: He is alert.           Assessment & Plan:  OA in the hands. He will try Voltaren gel QID as needed.  Alysia Penna, MD

## 2020-07-10 DIAGNOSIS — N319 Neuromuscular dysfunction of bladder, unspecified: Secondary | ICD-10-CM | POA: Diagnosis not present

## 2020-07-10 DIAGNOSIS — R339 Retention of urine, unspecified: Secondary | ICD-10-CM | POA: Diagnosis not present

## 2020-08-09 DIAGNOSIS — R339 Retention of urine, unspecified: Secondary | ICD-10-CM | POA: Diagnosis not present

## 2020-08-09 DIAGNOSIS — N319 Neuromuscular dysfunction of bladder, unspecified: Secondary | ICD-10-CM | POA: Diagnosis not present

## 2020-09-03 ENCOUNTER — Encounter: Payer: Self-pay | Admitting: Physician Assistant

## 2020-09-10 DIAGNOSIS — N319 Neuromuscular dysfunction of bladder, unspecified: Secondary | ICD-10-CM | POA: Diagnosis not present

## 2020-09-10 DIAGNOSIS — R339 Retention of urine, unspecified: Secondary | ICD-10-CM | POA: Diagnosis not present

## 2020-10-02 ENCOUNTER — Ambulatory Visit: Payer: Medicare Other | Admitting: Physician Assistant

## 2020-10-02 ENCOUNTER — Encounter: Payer: Self-pay | Admitting: Physician Assistant

## 2020-10-02 VITALS — BP 118/70 | HR 56

## 2020-10-02 DIAGNOSIS — Z1211 Encounter for screening for malignant neoplasm of colon: Secondary | ICD-10-CM

## 2020-10-02 DIAGNOSIS — R197 Diarrhea, unspecified: Secondary | ICD-10-CM | POA: Diagnosis not present

## 2020-10-02 NOTE — Progress Notes (Signed)
I agree with the above note, plan 

## 2020-10-02 NOTE — Progress Notes (Signed)
Chief Complaint: Screening for colorectal cancer  HPI:     [name] is a 70 year old male with a past medical history as listed below including diabetes and paraplegia, known to Dr. Ardis Hughs, who was referred to me by Laurey Morale, MD for consideration of a screening colonoscopy.    06/25/2008 colonoscopy with a 1 cm polyp removed from the transverse colon, proximal anal canal ulceration, 1 cm (anal fissure?,  Neoplasm?),  Otherwise normal.  Pathology showed hemorrhoidal tissue with mucosal ulceration and a fibroepithelial polyp.  Repeat was recommended in 10 years.    Today, patient presents to clinic and explains that he is doing fairly well.  When he was started on Metformin he started with some occasional maybe once every other week or so days of "upset stomach and diarrhea".  During those times he will typically use Imodium which controls his symptoms well.  Tells me the only problem with this is that he really "does not have a lot of feeling down there", and sometimes he has accidents.      Previously worked as a Corporate treasurer, now Government social research officer for fun.    Denies fever, chills, frequent heartburn or reflux, abdominal pain or symptoms that awaken him from sleep.  Past Medical History:  Diagnosis Date   Allergic rhinitis    Arthritis    History of nephrolithiasis    Hyperlipidemia    Hyperlipidemia    Hypertension    Paraplegia ([place])    from pelvic down--  stands w/ assist and transfers self   Right hydrocele    T12 spinal cord injury ([place])    1980-- [place]   Type 2 diabetes mellitus ([place])    Wears glasses    Wheelchair bound     Past Surgical History:  Procedure Laterality Date   COLONOSCOPY  06/25/08   per Dr. Ardis Hughs benign polyp repeat in 10 years   HYDROCELE EXCISION Right 03/29/2013   Procedure: RIGHT HYDROCELECTOMY ADULT;  Surgeon: Ardis Hughs, MD;  Location: [facility];  Service: Urology;  Laterality: Right;    INTERNAL AND EXTERNAL HEMORRHOIDECTOMY  07-31-2008   lysis of the spinal cord  1980   at T12 injury fell out of  tree     Current Outpatient Medications  Medication Sig Dispense Refill   ACCU-CHEK FASTCLIX LANCETS MISC USE AS DIRECTED TO TEST BLOOD SUGAR DAILY 102 each 3   amLODipine (NORVASC) 10 MG tablet TAKE 1 TABLET(10 MG) BY MOUTH DAILY 90 tablet 3   aspirin 81 MG tablet Take 1 tablet (81 mg total) by mouth daily. Resume in 48hrs. 90 tablet 3   atorvastatin (LIPITOR) 20 MG tablet Take 1 tablet (20 mg total) by mouth daily. 90 tablet 3   ciprofloxacin (CIPRO) 500 MG tablet Take 1 tablet (500 mg total) by mouth 2 (two) times daily. 20 tablet 0   glipiZIDE (GLUCOTROL) 10 MG tablet TAKE 1 TABLET(10 MG) BY MOUTH TWICE DAILY BEFORE A MEAL 180 tablet 3   glucose blood (ACCU-CHEK AVIVA PLUS) test strip TEST ONE EVERY DAY AS DIRECTED 100 each 3   halobetasol (ULTRAVATE) 0.05 % ointment Apply topically 2 (two) times daily. 50 g 2   hydrochlorothiazide (HYDRODIURIL) 25 MG tablet TAKE 1 TABLET(25 MG) BY MOUTH DAILY 90 tablet 3   lisinopril (ZESTRIL) 20 MG tablet TAKE 1 TABLET(20 MG) BY MOUTH DAILY 90 tablet 1   metFORMIN (GLUCOPHAGE) 500 MG tablet TAKE 1 TABLET(500 MG) BY MOUTH  TWICE DAILY WITH A MEAL 180 tablet 3   metoprolol tartrate (LOPRESSOR) 100 MG tablet TAKE 1 TABLET(100 MG) BY MOUTH TWICE DAILY 180 tablet 1   pioglitazone (ACTOS) 15 MG tablet TAKE 1 TABLET(15 MG) BY MOUTH DAILY 90 tablet 1   solifenacin (VESICARE) 5 MG tablet Take 1 tablet (5 mg total) by mouth daily. 30 tablet 5   No current facility-administered medications for this visit.    Allergies as of 10/02/2020   (No Known Allergies)    Family History  Problem Relation Age of Onset   Coronary artery disease Other    Diabetes Other    Hypertension Other    Liver disease Other    Stroke Other    Thyroid disease Other     Social History   Socioeconomic History   Marital status: Married    Spouse name: Not on  file   Number of children: Not on file   Years of education: Not on file   Highest education level: Not on file  Occupational History   Not on file  Tobacco Use   Smoking status: Former    Types: Cigarettes    Quit date: 03/22/1998    Years since quitting: 22.5   Smokeless tobacco: Never  Substance and Sexual Activity   Alcohol use: Yes    Alcohol/week: 0.0 standard drinks    Comment: rare   Drug use: No   Sexual activity: Not on file  Other Topics Concern   Not on file  Social History Narrative   Married         Social Determinants of Health   Financial Resource Strain: Low Risk    Difficulty of Paying Living Expenses: Not hard at all  Food Insecurity: No Food Insecurity   Worried About Charity fundraiser in the Last Year: Never true   Arboriculturist in the Last Year: Never true  Transportation Needs: No Transportation Needs   Lack of Transportation (Medical): No   Lack of Transportation (Non-Medical): No  Physical Activity: Inactive   Days of Exercise per Week: 0 days   Minutes of Exercise per Session: 0 min  Stress: No Stress Concern Present   Feeling of Stress : Not at all  Social Connections: Moderately Integrated   Frequency of Communication with Friends and Family: More than three times a week   Frequency of Social Gatherings with Friends and Family: More than three times a week   Attends Religious Services: Never   Marine scientist or Organizations: No   Attends Music therapist: 1 to 4 times per year   Marital Status: Married  Human resources officer Violence: Not At Risk   Fear of Current or Ex-Partner: No   Emotionally Abused: No   Physically Abused: No   Sexually Abused: No    Review of Systems:    Constitutional: No weight loss, fever or chills Skin: No rash  Cardiovascular: No chest pain Respiratory: No SOB Gastrointestinal: See HPI and otherwise negative Genitourinary: No dysuria  Neurological: No headache, dizziness or  syncope Musculoskeletal: No new muscle or joint pain Hematologic: No bleeding  Psychiatric: No history of depression or anxiety   Physical Exam:  Vital signs: BP 118/70 (BP Location: Left Arm, Patient Position: Sitting, Cuff Size: Normal)   Pulse (!) 56    Constitutional:   Pleasant Caucasian male appears to be in NAD, Well developed, Well nourished, alert and cooperative Head:  Normocephalic and atraumatic. Eyes:   PEERL,  EOMI. No icterus. Conjunctiva pink. Ears:  Normal auditory acuity. Neck:  Supple Throat: Oral cavity and pharynx without inflammation, swelling or lesion.  Respiratory: Respirations even and unlabored. Lungs clear to auscultation bilaterally.   No wheezes, crackles, or rhonchi.  Cardiovascular: Normal S1, S2. No MRG. Regular rate and rhythm. No peripheral edema, cyanosis or pallor.  Gastrointestinal:  Soft, nondistended, nontender. No rebound or guarding. Normal bowel sounds. No appreciable masses or hepatomegaly. Rectal:  Not performed.  Msk:  Symmetrical without gross deformities. +paraplegic, in wheelchair Neurologic:  Alert and  oriented x4;  grossly normal neurologically.  Skin:   Dry and intact without significant lesions or rashes. Psychiatric: Demonstrates good judgement and reason without abnormal affect or behaviors.  RELEVANT LABS AND IMAGING: CBC    Component Value Date/Time   WBC 4.8 02/22/2020 1041   RBC 4.26 02/22/2020 1041   HGB 12.5 (L) 02/22/2020 1041   HCT 37.7 (L) 02/22/2020 1041   PLT 211.0 02/22/2020 1041   MCV 88.4 02/22/2020 1041   MCH 29.4 09/25/2016 1633   MCHC 33.3 02/22/2020 1041   RDW 14.0 02/22/2020 1041   LYMPHSABS 1.4 02/22/2020 1041   MONOABS 0.5 02/22/2020 1041   EOSABS 0.3 02/22/2020 1041   BASOSABS 0.0 02/22/2020 1041    CMP     Component Value Date/Time   NA 133 (L) 02/22/2020 1041   K 4.2 02/22/2020 1041   CL 105 02/22/2020 1041   CO2 21 02/22/2020 1041   GLUCOSE 81 02/22/2020 1041   BUN 25 (H) 02/22/2020  1041   CREATININE 1.10 02/22/2020 1041   CREATININE 1.19 09/25/2016 1633   CALCIUM 8.8 02/22/2020 1041   PROT 7.2 02/22/2020 1041   ALBUMIN 4.2 02/22/2020 1041   AST 18 02/22/2020 1041   ALT 17 02/22/2020 1041   ALKPHOS 83 02/22/2020 1041   BILITOT 0.9 02/22/2020 1041   GFRNONAA 105.67 05/15/2009 0832   GFRAA  07/30/2008 0840    >60        The eGFR has been calculated using the MDRD equation. This calculation has not been validated in all clinical situations. eGFR's persistently <60 mL/min signify possible Chronic Kidney Disease.    Assessment: 1.  Screening for colorectal cancer: Last colonoscopy in 2010 with 1 benign rectal polyp, repeat recommended in 10 years 2.  Diarrhea: Started after patient was put on Metformin, occurs maybe once every 2 weeks controlled with Imodium; likely medication side effect  Plan: 1.  Patient would like to try Cologuard for his colon cancer screening at this time to try and avoid the bowel prep which is more difficult for him in his wheelchair.  Ordered Cologuard.  Did discuss that if this returns positive then he will require colonoscopy.  Patient is able to transfer himself and this could be completed in the [place] with Dr. Ardis Hughs.  He verbalized understanding.  If testing is negative then he will need repeat Cologuard in 3 years, his primary care provider can order this for him. 2.  Discussed diarrhea, most likely this is his Metformin, explained that if he is not having very frequent episodes and his diabetes is under well control then he should continue his Metformin and use Imodium as needed as he is doing now.  Did discuss that he could take a tablet preemptively if he is going somewhere and does not want to have any trouble. 3.  Patient to follow in clinic per recommendations after results from Cologuard.  Ellouise Newer, PA-C [facility] 10/02/2020, 9:29  AM  Cc: Laurey Morale, MD

## 2020-10-02 NOTE — Patient Instructions (Signed)
Your provider has ordered Cologuard testing as an option for colon cancer screening. This is performed by [employer] and may be out of network with your insurance. PRIOR to completing the test, it is YOUR responsibility to contact your insurance about covered benefits for this test. Your out of pocket expense could be anywhere from $0.00 to $649.00.   When you call to check coverage with your insurer, please provide the following information:   -The ONLY provider of Cologuard is [place] code for Cologuard is [phone].  [employer] NPI # [id number]  -[employer] Tax ID # [id number]   We have already sent your demographic and insurance information to [employer] (phone number [phone]) and they should contact you within the next week regarding your test. If you have not heard from them within the next week, please call our office at [phone].  If you are age 69 or older, your body mass index should be between 23-30. Your There is no height or weight on file to calculate BMI. If this is out of the aforementioned range listed, please consider follow up with your Primary Care Provider.  If you are age 19 or younger, your body mass index should be between 19-25. Your There is no height or weight on file to calculate BMI. If this is out of the aformentioned range listed, please consider follow up with your Primary Care Provider.   __________________________________________________________  The [facility] providers would like to encourage you to use [facility] to communicate with providers for non-urgent requests or questions.  Due to long hold times on the telephone, sending your provider a message by [facility] may be a faster and more efficient way to get a response.  Please allow 48 business hours for a response.  Please remember that this is for non-urgent requests.

## 2020-10-10 DIAGNOSIS — N319 Neuromuscular dysfunction of bladder, unspecified: Secondary | ICD-10-CM | POA: Diagnosis not present

## 2020-10-10 DIAGNOSIS — R339 Retention of urine, unspecified: Secondary | ICD-10-CM | POA: Diagnosis not present

## 2020-10-17 DIAGNOSIS — Z1211 Encounter for screening for malignant neoplasm of colon: Secondary | ICD-10-CM | POA: Diagnosis not present

## 2020-10-23 LAB — COLOGUARD: Cologuard: POSITIVE — AB

## 2020-11-01 ENCOUNTER — Ambulatory Visit (AMBULATORY_SURGERY_CENTER): Payer: Medicare Other

## 2020-11-01 ENCOUNTER — Encounter: Payer: Self-pay | Admitting: Gastroenterology

## 2020-11-01 VITALS — Ht 72.0 in | Wt 200.0 lb

## 2020-11-01 DIAGNOSIS — R195 Other fecal abnormalities: Secondary | ICD-10-CM

## 2020-11-01 NOTE — Progress Notes (Signed)
No egg or soy allergy known to patient  No issues known to pt with past sedation with any surgeries or procedures Patient denies ever being told they had issues or difficulty with intubation  No FH of Malignant Hyperthermia Pt is not on diet pills Pt is not on  home 02  Pt is not on blood thinners  Pt denies issues with constipation  No A fib or A flutter  EMMI video to pt or via [place] 19 guidelines implemented in PV today with Pt and RN   Pt is fully vaccinated  for Covid   Miralax prep used,  Pt states he can transfer by himself with limited stabilizing help.  Due to the COVID-19 pandemic we are asking patients to follow certain guidelines.  Pt aware of COVID protocols and [facility] guidelines

## 2020-11-11 ENCOUNTER — Other Ambulatory Visit: Payer: Self-pay | Admitting: Family Medicine

## 2020-11-11 DIAGNOSIS — N319 Neuromuscular dysfunction of bladder, unspecified: Secondary | ICD-10-CM | POA: Diagnosis not present

## 2020-11-11 DIAGNOSIS — R339 Retention of urine, unspecified: Secondary | ICD-10-CM | POA: Diagnosis not present

## 2020-11-18 ENCOUNTER — Encounter: Payer: Self-pay | Admitting: Gastroenterology

## 2020-11-18 ENCOUNTER — Ambulatory Visit (AMBULATORY_SURGERY_CENTER): Payer: Medicare Other | Admitting: Gastroenterology

## 2020-11-18 ENCOUNTER — Other Ambulatory Visit: Payer: Self-pay

## 2020-11-18 VITALS — BP 111/46 | HR 47 | Temp 98.0°F | Resp 10 | Ht 72.0 in | Wt 200.0 lb

## 2020-11-18 DIAGNOSIS — D122 Benign neoplasm of ascending colon: Secondary | ICD-10-CM

## 2020-11-18 DIAGNOSIS — D120 Benign neoplasm of cecum: Secondary | ICD-10-CM

## 2020-11-18 DIAGNOSIS — K6289 Other specified diseases of anus and rectum: Secondary | ICD-10-CM

## 2020-11-18 DIAGNOSIS — R195 Other fecal abnormalities: Secondary | ICD-10-CM

## 2020-11-18 HISTORY — PX: COLONOSCOPY: SHX174

## 2020-11-18 MED ORDER — SODIUM CHLORIDE 0.9 % IV SOLN: 500.0000 mL | Freq: Once | INTRAVENOUS | Status: DC

## 2020-11-18 NOTE — Progress Notes (Signed)
To PACU, VSS. Report to [email or web address] 

## 2020-11-18 NOTE — Progress Notes (Signed)
VS completed by DT.  Pt's states no medical or surgical changes since previsit or office visit.  

## 2020-11-18 NOTE — Patient Instructions (Signed)
Read all of the handouts given to you by your recovery room nurse. ? ?YOU HAD AN ENDOSCOPIC PROCEDURE TODAY AT THE [facility]:   Refer to the procedure report that was given to you for any specific questions about what was found during the examination.  If the procedure report does not answer your questions, please call your gastroenterologist to clarify.  If you requested that your care partner not be given the details of your procedure findings, then the procedure report has been included in a sealed envelope for you to review at your convenience later. ? ?YOU SHOULD EXPECT: Some feelings of bloating in the abdomen. Passage of more gas than usual.  Walking can help get rid of the air that was put into your GI tract during the procedure and reduce the bloating. If you had a lower endoscopy (such as a colonoscopy or flexible sigmoidoscopy) you may notice spotting of blood in your stool or on the toilet paper. If you underwent a bowel prep for your procedure, you may not have a normal bowel movement for a few days. ? ?Please Note:  You might notice some irritation and congestion in your nose or some drainage.  This is from the oxygen used during your procedure.  There is no need for concern and it should clear up in a day or so. ? ?SYMPTOMS TO REPORT IMMEDIATELY: ? ?Following lower endoscopy (colonoscopy or flexible sigmoidoscopy): ? Excessive amounts of blood in the stool ? Significant tenderness or worsening of abdominal pains ? Swelling of the abdomen that is new, acute ? Fever of 100?F or higher ? ?  ?For urgent or emergent issues, a gastroenterologist can be reached at any hour by calling [phone]. ?Do not use MyChart messaging for urgent concerns.  ? ? ?DIET:  We do recommend a small meal at first, but then you may proceed to your regular diet.  Drink plenty of fluids but you should avoid alcoholic beverages for 24 hours. ? ?ACTIVITY:  You should plan to take it easy for the rest of today  and you should NOT DRIVE or use heavy machinery until tomorrow (because of the sedation medicines used during the test).   ? ?FOLLOW UP: ?Our staff will call the number listed on your records 48-72 hours following your procedure to check on you and address any questions or concerns that you may have regarding the information given to you following your procedure. If we do not reach you, we will leave a message.  We will attempt to reach you two times.  During this call, we will ask if you have developed any symptoms of COVID 19. If you develop any symptoms (ie: fever, flu-like symptoms, shortness of breath, cough etc.) before then, please call [phone].  If you test positive for Covid 19 in the 2 weeks post procedure, please call and report this information to us.   ? ?If any biopsies were taken you will be contacted by phone or by letter within the next 1-3 weeks.  Please call us at [phone] if you have not heard about the biopsies in 3 weeks.  ? ? ?SIGNATURES/CONFIDENTIALITY: ?You and/or your care partner have signed paperwork which will be entered into your electronic medical record.  These signatures attest to the fact that that the information above on your After Visit Summary has been reviewed and is understood.  Full responsibility of the confidentiality of this discharge information lies with you and/or your care-partner.  ?

## 2020-11-18 NOTE — Progress Notes (Signed)
Called to room to assist during endoscopic procedure.  Patient ID and intended procedure confirmed with present staff. Received instructions for my participation in the procedure from the performing physician.  

## 2020-11-18 NOTE — Op Note (Signed)
[place] Patient Name: Dwayne Simpson Procedure Date: 11/18/2020 2:29 PM MRN: [id number] Endoscopist: Milus Banister , MD Age: 70 Referring MD:  Date of Birth: 07/07/1950 Gender: Male Account #: [id number] Procedure:                Colonoscopy Indications:              Positive Cologuard test Medicines:                Monitored Anesthesia Care Procedure:                Pre-Anesthesia Assessment:                           - Prior to the procedure, a History and Physical                            was performed, and patient medications and                            allergies were reviewed. The patient's tolerance of                            previous anesthesia was also reviewed. The risks                            and benefits of the procedure and the sedation                            options and risks were discussed with the patient.                            All questions were answered, and informed consent                            was obtained. Prior Anticoagulants: The patient has                            taken no previous anticoagulant or antiplatelet                            agents. ASA Grade Assessment: II - A patient with                            mild systemic disease. After reviewing the risks                            and benefits, the patient was deemed in                            satisfactory condition to undergo the procedure.                           After obtaining informed consent, the colonoscope  was passed under direct vision. Throughout the                            procedure, the patient's blood pressure, pulse, and                            oxygen saturations were monitored continuously. The                            CF [id number] #[id number] was introduced through the anus                            and advanced to the the cecum, identified by                            appendiceal orifice and ileocecal valve.  The                            colonoscopy was performed without difficulty. The                            patient tolerated the procedure well. The quality                            of the bowel preparation was good. The ileocecal                            valve, appendiceal orifice, and rectum were                            photographed. Scope In: 2:37:13 PM Scope Out: 2:56:36 PM Scope Withdrawal Time: 0 hours 15 minutes 18 seconds  Total Procedure Duration: 0 hours 19 minutes 23 seconds  Findings:                 Four sessile polyps were found in the ascending                            colon and cecum. The polyps were 2 to 5 mm in size.                            These polyps were removed with a cold snare.                            Resection and retrieval were complete.                           There were several soft, somewhat inflammed                            appearing nodules circumferentially around anus at                            the distal rectum. These were biospied.  The exam was otherwise without abnormality on                            direct and retroflexion views. Complications:            No immediate complications. Estimated blood loss:                            None. Estimated Blood Loss:     Estimated blood loss: none. Impression:               - Four 2 to 5 mm polyps in the ascending colon and                            in the cecum, removed with a cold snare. Resected                            and retrieved.                           - There were several soft, somewhat inflammed                            appearing nodules circumferentially around anus at                            the distal rectum. These were biospied.                           - The examination was otherwise normal on direct                            and retroflexion views. Recommendation:           - Patient has a contact number available for                             emergencies. The signs and symptoms of potential                            delayed complications were discussed with the                            patient. Return to normal activities tomorrow.                            Written discharge instructions were provided to the                            patient.                           - Resume previous diet.                           - Continue present medications.                           -  Await pathology results. Milus Banister, MD 11/18/2020 3:05:38 PM This report has been signed electronically.

## 2020-11-18 NOTE — Progress Notes (Signed)
HPI: This is a man with cologuard + stool   ROS: complete GI ROS as described in HPI, all other review negative.  Constitutional:  No unintentional weight loss   Past Medical History:  Diagnosis Date   Allergic rhinitis    Allergy    seasonal   Arthritis    History of nephrolithiasis    Hyperlipidemia    Hyperlipidemia    Hypertension    Paraplegia ([place])    from pelvic down--  stands w/ assist and transfers self   Right hydrocele    T12 spinal cord injury ([place])    1980-- [place]   Type 2 diabetes mellitus ([place])    Wears glasses    Wheelchair bound     Past Surgical History:  Procedure Laterality Date   COLONOSCOPY  06/25/2008   per Dr. Ardis Hughs benign polyp repeat in 10 years   HYDROCELE EXCISION Right 03/29/2013   Procedure: RIGHT HYDROCELECTOMY ADULT;  Surgeon: Ardis Hughs, MD;  Location: [facility];  Service: Urology;  Laterality: Right;   INTERNAL AND EXTERNAL HEMORRHOIDECTOMY  07/31/2008   kidney stone     removal with lithotripsy   lysis of the spinal cord  02/09/1978   at T12 injury fell out of  tree     Current Outpatient Medications  Medication Sig Dispense Refill   ACCU-CHEK FASTCLIX LANCETS MISC USE AS DIRECTED TO TEST BLOOD SUGAR DAILY 102 each 3   amLODipine (NORVASC) 10 MG tablet TAKE 1 TABLET(10 MG) BY MOUTH DAILY 90 tablet 3   aspirin 81 MG tablet Take 1 tablet (81 mg total) by mouth daily. Resume in 48hrs. 90 tablet 3   atorvastatin (LIPITOR) 20 MG tablet Take 1 tablet (20 mg total) by mouth daily. 90 tablet 3   diclofenac Sodium (VOLTAREN) 1 % GEL Apply topically 4 (four) times daily as needed.     glipiZIDE (GLUCOTROL) 10 MG tablet TAKE 1 TABLET(10 MG) BY MOUTH TWICE DAILY BEFORE A MEAL 180 tablet 3   glucose blood (ACCU-CHEK AVIVA PLUS) test strip TEST ONE EVERY DAY AS DIRECTED 100 each 3   hydrochlorothiazide (HYDRODIURIL) 25 MG tablet TAKE 1 TABLET(25 MG) BY MOUTH DAILY 90 tablet 3   lisinopril (ZESTRIL) 20 MG  tablet TAKE 1 TABLET(20 MG) BY MOUTH DAILY 90 tablet 1   metFORMIN (GLUCOPHAGE) 500 MG tablet TAKE 1 TABLET(500 MG) BY MOUTH TWICE DAILY WITH A MEAL 180 tablet 3   metoprolol tartrate (LOPRESSOR) 100 MG tablet TAKE 1 TABLET(100 MG) BY MOUTH TWICE DAILY 180 tablet 1   pioglitazone (ACTOS) 15 MG tablet TAKE 1 TABLET(15 MG) BY MOUTH DAILY.  *Physical required for future refills* 90 tablet 0   VITAMIN D, CHOLECALCIFEROL, PO Take 600 Int'l Units/day by mouth daily.     Current Facility-Administered Medications  Medication Dose Route Frequency Provider Last Rate Last Admin   0.9 %  sodium chloride infusion  500 mL Intravenous Once Milus Banister, MD        Allergies as of 11/18/2020   (No Known Allergies)    Family History  Problem Relation Age of Onset   Hypertension Mother    Stroke Mother    Pancreatic cancer Father    Other Sister        Gluten sensitive   Thyroid disease Brother    Coronary artery disease Other    Colon cancer Neg Hx    Colon polyps Neg Hx    Esophageal cancer Neg Hx  Stomach cancer Neg Hx    Rectal cancer Neg Hx     Social History   Socioeconomic History   Marital status: Married    Spouse name: Not on file   Number of children: 3   Years of education: Not on file   Highest education level: Not on file  Occupational History   Occupation: artist retired  Tobacco Use   Smoking status: Former    Packs/day: 0.50    Years: 18.00    Pack years: 9.00    Types: Cigarettes    Quit date: 03/22/1998    Years since quitting: 22.6   Smokeless tobacco: Never   Tobacco comments:    Off and on smoker until 2000  Vaping Use   Vaping Use: Never used  Substance and Sexual Activity   Alcohol use: Yes    Comment: 2 glasses of wine daily   Drug use: Never   Sexual activity: Not on file  Other Topics Concern   Not on file  Social History Narrative   Married         Social Determinants of Health   Financial Resource Strain: Low Risk    Difficulty of  Paying Living Expenses: Not hard at all  Food Insecurity: No Food Insecurity   Worried About Charity fundraiser in the Last Year: Never true   Arboriculturist in the Last Year: Never true  Transportation Needs: No Transportation Needs   Lack of Transportation (Medical): No   Lack of Transportation (Non-Medical): No  Physical Activity: Inactive   Days of Exercise per Week: 0 days   Minutes of Exercise per Session: 0 min  Stress: No Stress Concern Present   Feeling of Stress : Not at all  Social Connections: Moderately Integrated   Frequency of Communication with Friends and Family: More than three times a week   Frequency of Social Gatherings with Friends and Family: More than three times a week   Attends Religious Services: Never   Marine scientist or Organizations: No   Attends Music therapist: 1 to 4 times per year   Marital Status: Married  Human resources officer Violence: Not At Risk   Fear of Current or Ex-Partner: No   Emotionally Abused: No   Physically Abused: No   Sexually Abused: No    Physical Exam: BP 133/66   Pulse (!) 46   Temp 98 F (36.7 C) (Temporal)   Ht 6' (1.829 m)   Wt 200 lb (90.7 kg)   SpO2 99%   BMI 27.12 kg/m  Constitutional: generally well-appearing Psychiatric: alert and oriented x3 Lungs: CTA bilaterally Heart: no MCR  Assessment and plan: 70 y.o. male with cologuard + stool  For colonoscopy today  Care is appropriate for the ambulatory setting.  Owens Loffler, MD [facility] 11/18/2020, 1:57 PM

## 2020-11-20 ENCOUNTER — Telehealth: Payer: Self-pay | Admitting: *Deleted

## 2020-11-20 NOTE — Telephone Encounter (Signed)
  Follow up Call-  Call back number 11/18/2020  Post procedure Call Back phone  # [phone]  Permission to leave phone message Yes  Some recent data might be hidden     Patient questions:  Do you have a fever, pain , or abdominal swelling? No. Pain Score  0 *  Have you tolerated food without any problems? Yes.    Have you been able to return to your normal activities? Yes.    Do you have any questions about your discharge instructions: Diet   No. Medications  No. Follow up visit  No.  Do you have questions or concerns about your Care? No.  Actions: * If pain score is 4 or above: No action needed, pain <4.  Have you developed a fever since your procedure? no  2.   Have you had an respiratory symptoms (SOB or cough) since your procedure? no  3.   Have you tested positive for COVID 19 since your procedure no  4.   Have you had any family members/close contacts diagnosed with the COVID 19 since your procedure?  no   If yes to any of these questions please route to Joylene John, RN and Joella Prince, RN

## 2020-11-27 ENCOUNTER — Other Ambulatory Visit: Payer: Self-pay | Admitting: Family Medicine

## 2020-12-11 DIAGNOSIS — R339 Retention of urine, unspecified: Secondary | ICD-10-CM | POA: Diagnosis not present

## 2020-12-11 DIAGNOSIS — N319 Neuromuscular dysfunction of bladder, unspecified: Secondary | ICD-10-CM | POA: Diagnosis not present

## 2021-01-05 ENCOUNTER — Other Ambulatory Visit: Payer: Self-pay | Admitting: Family Medicine

## 2021-01-09 DIAGNOSIS — N319 Neuromuscular dysfunction of bladder, unspecified: Secondary | ICD-10-CM | POA: Diagnosis not present

## 2021-01-09 DIAGNOSIS — R339 Retention of urine, unspecified: Secondary | ICD-10-CM | POA: Diagnosis not present

## 2021-02-08 ENCOUNTER — Other Ambulatory Visit: Payer: Self-pay | Admitting: Family Medicine

## 2021-02-10 DIAGNOSIS — N319 Neuromuscular dysfunction of bladder, unspecified: Secondary | ICD-10-CM | POA: Diagnosis not present

## 2021-02-10 DIAGNOSIS — R339 Retention of urine, unspecified: Secondary | ICD-10-CM | POA: Diagnosis not present

## 2021-03-12 DIAGNOSIS — R339 Retention of urine, unspecified: Secondary | ICD-10-CM | POA: Diagnosis not present

## 2021-03-12 DIAGNOSIS — N319 Neuromuscular dysfunction of bladder, unspecified: Secondary | ICD-10-CM | POA: Diagnosis not present

## 2021-04-09 DIAGNOSIS — N319 Neuromuscular dysfunction of bladder, unspecified: Secondary | ICD-10-CM | POA: Diagnosis not present

## 2021-04-09 DIAGNOSIS — R339 Retention of urine, unspecified: Secondary | ICD-10-CM | POA: Diagnosis not present

## 2021-05-09 ENCOUNTER — Other Ambulatory Visit: Payer: Self-pay | Admitting: Family Medicine

## 2021-05-13 DIAGNOSIS — N319 Neuromuscular dysfunction of bladder, unspecified: Secondary | ICD-10-CM | POA: Diagnosis not present

## 2021-05-13 DIAGNOSIS — R339 Retention of urine, unspecified: Secondary | ICD-10-CM | POA: Diagnosis not present

## 2021-05-30 ENCOUNTER — Other Ambulatory Visit: Payer: Self-pay | Admitting: Family Medicine

## 2021-06-10 DIAGNOSIS — N319 Neuromuscular dysfunction of bladder, unspecified: Secondary | ICD-10-CM | POA: Diagnosis not present

## 2021-06-10 DIAGNOSIS — R3914 Feeling of incomplete bladder emptying: Secondary | ICD-10-CM | POA: Diagnosis not present

## 2021-06-10 DIAGNOSIS — N401 Enlarged prostate with lower urinary tract symptoms: Secondary | ICD-10-CM | POA: Diagnosis not present

## 2021-06-11 DIAGNOSIS — R339 Retention of urine, unspecified: Secondary | ICD-10-CM | POA: Diagnosis not present

## 2021-06-11 DIAGNOSIS — N319 Neuromuscular dysfunction of bladder, unspecified: Secondary | ICD-10-CM | POA: Diagnosis not present

## 2021-06-19 ENCOUNTER — Ambulatory Visit (INDEPENDENT_AMBULATORY_CARE_PROVIDER_SITE_OTHER): Payer: Medicare Other

## 2021-06-19 DIAGNOSIS — Z0000 Encounter for general adult medical examination without abnormal findings: Secondary | ICD-10-CM

## 2021-06-19 NOTE — Progress Notes (Signed)
? ?Subjective:  ? Dwayne Simpson is a 71 y.o. male who presents for Medicare Annual/Subsequent preventive examination. ? ?Review of Systems    ?Virtual Visit via Telephone Note ? ?I connected with  Dwayne Simpson on 06/19/21 at 10:45 AM EDT by telephone and verified that I am speaking with the correct person using two identifiers. ? ?Location: ?Patient: Home  ?Provider: Office ?Persons participating in the virtual visit: patient/Nurse Health Advisor ?  ?I discussed the limitations, risks, security and privacy concerns of performing an evaluation and management service by telephone and the availability of in person appointments. The patient expressed understanding and agreed to proceed. ? ?Interactive audio and video telecommunications were attempted between this nurse and patient, however failed, due to patient having technical difficulties OR patient did not have access to video capability.  We continued and completed visit with audio only. ? ?Some vital signs may be absent or patient reported.  ? ?Criselda Peaches, LPN  ?Cardiac Risk Factors include: advanced age (>70mn, >>33women);diabetes mellitus;hypertension;male gender;sedentary lifestyle (W/C bound) ? ?   ?Objective:  ?  ?Today's Vitals  ? ?There is no height or weight on file to calculate BMI. ? ? ?  06/19/2021  ? 11:14 AM 06/18/2020  ?  1:29 PM  ?Advanced Directives  ?Does Patient Have a Medical Advance Directive? No No  ?Would patient like information on creating a medical advance directive? No - Patient declined No - Patient declined  ? ? ?Current Medications (verified) ?Outpatient Encounter Medications as of 06/19/2021  ?Medication Sig  ? ACCU-CHEK FASTCLIX LANCETS MISC USE AS DIRECTED TO TEST BLOOD SUGAR DAILY  ? amLODipine (NORVASC) 10 MG tablet TAKE 1 TABLET(10 MG) BY MOUTH DAILY  ? aspirin 81 MG tablet Take 1 tablet (81 mg total) by mouth daily. Resume in 48hrs.  ? atorvastatin (LIPITOR) 20 MG tablet TAKE 1 TABLET(20 MG) BY MOUTH DAILY  ? diclofenac  Sodium (VOLTAREN) 1 % GEL Apply topically 4 (four) times daily as needed.  ? glipiZIDE (GLUCOTROL) 10 MG tablet TAKE 1 TABLET(10 MG) BY MOUTH TWICE DAILY BEFORE A MEAL  ? glucose blood (ACCU-CHEK AVIVA PLUS) test strip TEST ONE EVERY DAY AS DIRECTED  ? hydrochlorothiazide (HYDRODIURIL) 25 MG tablet TAKE 1 TABLET(25 MG) BY MOUTH DAILY  ? lisinopril (ZESTRIL) 20 MG tablet TAKE 1 TABLET(20 MG) BY MOUTH DAILY.  *appointment required for future refills*  ? metFORMIN (GLUCOPHAGE) 500 MG tablet TAKE 1 TABLET(500 MG) BY MOUTH TWICE DAILY WITH A MEAL  ? metoprolol tartrate (LOPRESSOR) 100 MG tablet Take 1 tablet (100 mg total) by mouth 2 (two) times daily. *appointment required for future refills  ? pioglitazone (ACTOS) 15 MG tablet TAKE 1 TABLET BY MOUTH EVERY DAY  ? VITAMIN D, CHOLECALCIFEROL, PO Take 600 Int'l Units/day by mouth daily.  ? ?No facility-administered encounter medications on file as of 06/19/2021.  ? ? ?Allergies (verified) ?Patient has no known allergies.  ? ?History: ?Past Medical History:  ?Diagnosis Date  ? Allergic rhinitis   ? Allergy   ? seasonal  ? Arthritis   ? History of nephrolithiasis   ? Hyperlipidemia   ? Hyperlipidemia   ? Hypertension   ? [place]   ? from pelvic down--  stands w/ assist and transfers self  ? Right hydrocele   ? [place]   ? [place] ? Type 2 diabetes mellitus ([place]   ? Wears glasses   ? Wheelchair bound   ? ?  Past Surgical History:  ?Procedure Laterality Date  ? COLONOSCOPY  06/25/2008  ? per Dr. Ardis Hughs benign polyp repeat in 10 years  ? HYDROCELE EXCISION Right 03/29/2013  ? Procedure: RIGHT HYDROCELECTOMY ADULT;  Surgeon: Ardis Hughs, MD;  [facility]: [facility];  Service: Urology;  Laterality: Right;  ? INTERNAL AND EXTERNAL HEMORRHOIDECTOMY  07/31/2008  ? kidney stone    ? removal with lithotripsy  ? lysis of the spinal cord  02/09/1978  ? at T12 injury fell out of  tree   ? ?Family History  ?Problem Relation  Age of Onset  ? Hypertension Mother   ? Stroke Mother   ? Pancreatic cancer Father   ? Other Sister   ?     Gluten sensitive  ? Thyroid disease Brother   ? Coronary artery disease Other   ? Colon cancer Neg Hx   ? Colon polyps Neg Hx   ? Esophageal cancer Neg Hx   ? Stomach cancer Neg Hx   ? Rectal cancer Neg Hx   ? ?Social History  ? ?Socioeconomic History  ? Marital status: Married  ?  Spouse name: Not on file  ? Number of children: 3  ? Years of education: Not on file  ? Highest education level: Not on file  ?Occupational History  ? Occupation: Training and development officer retired  ?Tobacco Use  ? Smoking status: Former  ?  Packs/day: 0.50  ?  Years: 18.00  ?  Pack years: 9.00  ?  Types: Cigarettes  ?  Quit date: 03/22/1998  ?  Years since quitting: 23.2  ? Smokeless tobacco: Never  ? Tobacco comments:  ?  Off and on smoker until 2000  ?Vaping Use  ? Vaping Use: Never used  ?Substance and Sexual Activity  ? Alcohol use: Yes  ?  Comment: 2 glasses of wine daily  ? Drug use: Never  ? Sexual activity: Not on file  ?Other Topics Concern  ? Not on file  ?Social History Narrative  ? Married  ?   ?   ? ?Social Determinants of Health  ? ?Financial Resource Strain: Low Risk   ? Difficulty of Paying Living Expenses: Not hard at all  ?Food Insecurity: No Food Insecurity  ? Worried About Charity fundraiser in the Last Year: Never true  ? Ran Out of Food in the Last Year: Never true  ?Transportation Needs: No Transportation Needs  ? Lack of Transportation (Medical): No  ? Lack of Transportation (Non-Medical): No  ?Physical Activity: Insufficiently Active  ? Days of Exercise per Week: 5 days  ? Minutes of Exercise per Session: 20 min  ?Stress: No Stress Concern Present  ? Feeling of Stress : Not at all  ?Social Connections: Socially Integrated  ? Frequency of Communication with Friends and Family: More than three times a week  ? Frequency of Social Gatherings with Friends and Family: More than three times a week  ? Attends Religious Services: 1 to  4 times per year  ? Active Member of Clubs or Organizations: Yes  ? Attends Archivist Meetings: 1 to 4 times per year  ? Marital Status: Married  ? ? ?Tobacco Counseling ?Counseling given: Not Answered ?Tobacco comments: Off and on smoker until 2000 ? ? ?Clinical Intake: ?Nutrition Risk Assessment: ? ?Has the patient had any N/V/D within the last 2 months?  No  ?Does the patient have any non-healing wounds?  No  ?Has the patient had any unintentional weight loss  or weight gain?  No  ? ?Diabetes: ? ?Is the patient diabetic?  Yes  ?If diabetic, was a CBG obtained today?  No  ?Did the patient bring in their glucometer from home?  No  ?How often do you monitor your CBG's? Daily.  ? ?Financial Strains and Diabetes Management: ? ?Are you having any financial strains with the device, your supplies or your medication? No .  ?Does the patient want to be seen by Chronic Care Management for management of their diabetes?  No  ?Would the patient like to be referred to a Nutritionist or for Diabetic Management?  No  ? ?Diabetic Exams: ? ?Diabetic Eye Exam: Completed Yes. Overdue for diabetic eye exam. Pt has been advised about the importance in completing this exam. A referral has been placed today. Message sent to referral coordinator for scheduling purposes. Advised pt to expect a call from office referred to regarding appt. ? ?Diabetic Foot Exam: Completed Yes. Pt has been advised about the importance in completing this exam. Pt is scheduled for diabetic foot exam on Followed by PCP.   ?Pre-visit preparation completed: NoHow often do you need to have someone help you when you read instructions, pamphlets, or other written materials from your doctor or pharmacy?: 1 - Never ? ?Diabetic?  Yes ? ?Interpreter Needed?: No ?Activities of Daily Living ? ?  06/19/2021  ? 11:07 AM  ?In your present state of health, do you have any difficulty performing the following activities:  ?Hearing? 0  ?Vision? 0  ?Difficulty  concentrating or making decisions? 0  ?Walking or climbing stairs? 1  ?Comment W/C Bound  ?Dressing or bathing? 0  ?Doing errands, shopping? 0  ?Comment Wife assist  ?Preparing Food and eating ? N  ?Using the [facility]

## 2021-06-19 NOTE — Patient Instructions (Addendum)
?Mr. Nunn , ?Thank you for taking time to come for your Medicare Wellness Visit. I appreciate your ongoing commitment to your health goals. Please review the following plan we discussed and let me know if I can assist you in the future.  ? ?These are the goals we discussed: ? Goals   ? ?   Lose weight (pt-stated)   ?   Watch what you eat. ?  ? ?  ?  ?This is a list of the screening recommended for you and due dates:  ?Health Maintenance  ?Topic Date Due  ? Complete foot exam   Never done  ? Hepatitis C Screening: USPSTF Recommendation to screen - Ages 52-79 yo.  Never done  ? Hemoglobin A1C  08/21/2020  ? Eye exam for diabetics  11/02/2020  ? COVID-19 Vaccine (3 - Booster for Pfizer series) 07/05/2021*  ? Zoster (Shingles) Vaccine (1 of 2) 09/19/2021*  ? Flu Shot  09/09/2021  ? Tetanus Vaccine  01/02/2023  ? Colon Cancer Screening  11/19/2027  ? Pneumonia Vaccine  Completed  ? HPV Vaccine  Aged Out  ?*Topic was postponed. The date shown is not the original due date.  ? ?Advanced directives: No ? ?Conditions/risks identified: None ? ?Next appointment: Follow up in one year for your annual wellness visit.  ? ? ?Preventive Care 65 Years and Older, Male ?Preventive care refers to lifestyle choices and visits with your health care provider that can promote health and wellness. ?What does preventive care include? ?A yearly physical exam. This is also called an annual well check. ?Dental exams once or twice a year. ?Routine eye exams. Ask your health care provider how often you should have your eyes checked. ?Personal lifestyle choices, including: ?Daily care of your teeth and gums. ?Regular physical activity. ?Eating a healthy diet. ?Avoiding tobacco and drug use. ?Limiting alcohol use. ?Practicing safe sex. ?Taking low doses of aspirin every day. ?Taking vitamin and mineral supplements as recommended by your health care provider. ?What happens during an annual well check? ?The services and screenings done by your  health care provider during your annual well check will depend on your age, overall health, lifestyle risk factors, and family history of disease. ?Counseling  ?Your health care provider may ask you questions about your: ?Alcohol use. ?Tobacco use. ?Drug use. ?Emotional well-being. ?Home and relationship well-being. ?Sexual activity. ?Eating habits. ?History of falls. ?Memory and ability to understand (cognition). ?Work and work Statistician. ?Screening  ?You may have the following tests or measurements: ?Height, weight, and BMI. ?Blood pressure. ?Lipid and cholesterol levels. These may be checked every 5 years, or more frequently if you are over 76 years old. ?Skin check. ?Lung cancer screening. You may have this screening every year starting at age 33 if you have a 30-pack-year history of smoking and currently smoke or have quit within the past 15 years. ?Fecal occult blood test (FOBT) of the stool. You may have this test every year starting at age 2. ?Flexible sigmoidoscopy or colonoscopy. You may have a sigmoidoscopy every 5 years or a colonoscopy every 10 years starting at age 71. ?Prostate cancer screening. Recommendations will vary depending on your family history and other risks. ?Hepatitis C blood test. ?Hepatitis B blood test. ?Sexually transmitted disease (STD) testing. ?Diabetes screening. This is done by checking your blood sugar (glucose) after you have not eaten for a while (fasting). You may have this done every 1-3 years. ?Abdominal aortic aneurysm (AAA) screening. You may need this if you are  a current or former smoker. ?Osteoporosis. You may be screened starting at age 69 if you are at high risk. ?Talk with your health care provider about your test results, treatment options, and if necessary, the need for more tests. ?Vaccines  ?Your health care provider may recommend certain vaccines, such as: ?Influenza vaccine. This is recommended every year. ?Tetanus, diphtheria, and acellular pertussis  (Tdap, Td) vaccine. You may need a Td booster every 10 years. ?Zoster vaccine. You may need this after age 56. ?Pneumococcal 13-valent conjugate (PCV13) vaccine. One dose is recommended after age 36. ?Pneumococcal polysaccharide (PPSV23) vaccine. One dose is recommended after age 54. ?Talk to your health care provider about which screenings and vaccines you need and how often you need them. ?This information is not intended to replace advice given to you by your health care provider. Make sure you discuss any questions you have with your health care provider. ?Document Released: 02/22/2015 Document Revised: 10/16/2015 Document Reviewed: 11/27/2014 ?Elsevier Interactive Patient Education ? 2017 [place]. ? ?Fall Prevention in the Home ?Falls can cause injuries. They can happen to people of all ages. There are many things you can do to make your home safe and to help prevent falls. ?What can I do on the outside of my home? ?Regularly fix the edges of walkways and driveways and fix any cracks. ?Remove anything that might make you trip as you walk through a door, such as a raised step or threshold. ?Trim any bushes or trees on the path to your home. ?Use bright outdoor lighting. ?Clear any walking paths of anything that might make someone trip, such as rocks or tools. ?Regularly check to see if handrails are loose or broken. Make sure that both sides of any steps have handrails. ?Any raised decks and porches should have guardrails on the edges. ?Have any leaves, snow, or ice cleared regularly. ?Use sand or salt on walking paths during winter. ?Clean up any spills in your garage right away. This includes oil or grease spills. ?What can I do in the bathroom? ?Use night lights. ?Install grab bars by the toilet and in the tub and shower. Do not use towel bars as grab bars. ?Use non-skid mats or decals in the tub or shower. ?If you need to sit down in the shower, use a plastic, non-slip stool. ?Keep the floor dry. Clean  up any water that spills on the floor as soon as it happens. ?Remove soap buildup in the tub or shower regularly. ?Attach bath mats securely with double-sided non-slip rug tape. ?Do not have throw rugs and other things on the floor that can make you trip. ?What can I do in the bedroom? ?Use night lights. ?Make sure that you have a light by your bed that is easy to reach. ?Do not use any sheets or blankets that are too big for your bed. They should not hang down onto the floor. ?Have a firm chair that has side arms. You can use this for support while you get dressed. ?Do not have throw rugs and other things on the floor that can make you trip. ?What can I do in the kitchen? ?Clean up any spills right away. ?Avoid walking on wet floors. ?Keep items that you use a lot in easy-to-reach places. ?If you need to reach something above you, use a strong step stool that has a grab bar. ?Keep electrical cords out of the way. ?Do not use floor polish or wax that makes floors slippery. If you must  use wax, use non-skid floor wax. ?Do not have throw rugs and other things on the floor that can make you trip. ?What can I do with my stairs? ?Do not leave any items on the stairs. ?Make sure that there are handrails on both sides of the stairs and use them. Fix handrails that are broken or loose. Make sure that handrails are as long as the stairways. ?Check any carpeting to make sure that it is firmly attached to the stairs. Fix any carpet that is loose or worn. ?Avoid having throw rugs at the top or bottom of the stairs. If you do have throw rugs, attach them to the floor with carpet tape. ?Make sure that you have a light switch at the top of the stairs and the bottom of the stairs. If you do not have them, ask someone to add them for you. ?What else can I do to help prevent falls? ?Wear shoes that: ?Do not have high heels. ?Have rubber bottoms. ?Are comfortable and fit you well. ?Are closed at the toe. Do not wear sandals. ?If you  use a stepladder: ?Make sure that it is fully opened. Do not climb a closed stepladder. ?Make sure that both sides of the stepladder are locked into place. ?Ask someone to hold it for you, if possible. ?Clearly

## 2021-06-20 ENCOUNTER — Ambulatory Visit (INDEPENDENT_AMBULATORY_CARE_PROVIDER_SITE_OTHER): Payer: Medicare Other | Admitting: Family Medicine

## 2021-06-20 ENCOUNTER — Encounter: Payer: Self-pay | Admitting: Family Medicine

## 2021-06-20 VITALS — BP 126/74 | HR 51 | Temp 98.3°F | Ht 72.0 in | Wt 216.4 lb

## 2021-06-20 DIAGNOSIS — I10 Essential (primary) hypertension: Secondary | ICD-10-CM

## 2021-06-20 DIAGNOSIS — T1490XS Injury, unspecified, sequela: Secondary | ICD-10-CM

## 2021-06-20 DIAGNOSIS — E119 Type 2 diabetes mellitus without complications: Secondary | ICD-10-CM

## 2021-06-20 DIAGNOSIS — M19042 Primary osteoarthritis, left hand: Secondary | ICD-10-CM

## 2021-06-20 DIAGNOSIS — E782 Mixed hyperlipidemia: Secondary | ICD-10-CM

## 2021-06-20 DIAGNOSIS — M19041 Primary osteoarthritis, right hand: Secondary | ICD-10-CM

## 2021-06-20 DIAGNOSIS — Z209 Contact with and (suspected) exposure to unspecified communicable disease: Secondary | ICD-10-CM

## 2021-06-20 DIAGNOSIS — R609 Edema, unspecified: Secondary | ICD-10-CM

## 2021-06-20 NOTE — Progress Notes (Signed)
? ?Subjective:  ? ? Patient ID: Hilaria Ota, male    DOB: March 28, 1950, 71 y.o.   MRN: [id number] ? ?HPI ?Here to follow up on issues. He is doing fairly well. His BP is stable. His am fasting glucoses average 80-100. He saw Dr. Louis Meckel a few days ago and had a normal urologic exam. He says his PSA was below 4.0. His OA is stable. He and his wife stay busy watching their 70 year old grandson every afternoon.  ? ? ?Review of Systems  ?Constitutional: Negative.   ?HENT: Negative.    ?Eyes: Negative.   ?Respiratory: Negative.    ?Cardiovascular: Negative.   ?Gastrointestinal: Negative.   ?Genitourinary: Negative.   ?Musculoskeletal: Negative.   ?Skin: Negative.   ?Neurological:  Positive for weakness.  ?Psychiatric/Behavioral: Negative.    ? ?   ?Objective:  ? Physical Exam ?Constitutional:   ?   General: He is not in acute distress. ?   Appearance: Normal appearance. He is well-developed. He is not diaphoretic.  ?   Comments: In a wheelchair   ?HENT:  ?   Head: Normocephalic and atraumatic.  ?   Right Ear: External ear normal.  ?   Left Ear: External ear normal.  ?   Nose: Nose normal.  ?   Mouth/Throat:  ?   Pharynx: No oropharyngeal exudate.  ?Eyes:  ?   General: No scleral icterus.    ?   Right eye: No discharge.     ?   Left eye: No discharge.  ?   Conjunctiva/sclera: Conjunctivae normal.  ?   Pupils: Pupils are equal, round, and reactive to light.  ?Neck:  ?   Thyroid: No thyromegaly.  ?   Vascular: No JVD.  ?   Trachea: No tracheal deviation.  ?Cardiovascular:  ?   Rate and Rhythm: Normal rate and regular rhythm.  ?   Heart sounds: Normal heart sounds. No murmur heard. ?  No friction rub. No gallop.  ?Pulmonary:  ?   Effort: Pulmonary effort is normal. No respiratory distress.  ?   Breath sounds: Normal breath sounds. No wheezing or rales.  ?Chest:  ?   Chest wall: No tenderness.  ?Abdominal:  ?   General: Bowel sounds are normal. There is no distension.  ?   Palpations: Abdomen is soft. There is no mass.  ?    Tenderness: There is no abdominal tenderness. There is no guarding or rebound.  ?Genitourinary: ?   Penis: No tenderness.   ?Musculoskeletal:     ?   General: No tenderness. Normal range of motion.  ?   Cervical back: Neck supple.  ?   Comments: 2+ edema in  both lower legs (he wears compression stockings)   ?Lymphadenopathy:  ?   Cervical: No cervical adenopathy.  ?Skin: ?   General: Skin is warm and dry.  ?   Coloration: Skin is not pale.  ?   Findings: No erythema or rash.  ?Neurological:  ?   Mental Status: He is alert and oriented to person, place, and time.  ?   Cranial Nerves: No cranial nerve deficit.  ?   Motor: No abnormal muscle tone.  ?   Coordination: Coordination normal.  ?   Deep Tendon Reflexes: Reflexes are normal and symmetric. Reflexes normal.  ?Psychiatric:     ?   Behavior: Behavior normal.     ?   Thought Content: Thought content normal.     ?   Judgment:  Judgment normal.  ? ? ? ? ? ?   ?Assessment & Plan:  ?His diabetes and HTN are stable. His OA is stable. We will set up fasting labs soon to check lipids, an A1c, etc.  ?Alysia Penna, MD ? ?

## 2021-06-24 ENCOUNTER — Other Ambulatory Visit (INDEPENDENT_AMBULATORY_CARE_PROVIDER_SITE_OTHER): Payer: Medicare Other

## 2021-06-24 DIAGNOSIS — Z209 Contact with and (suspected) exposure to unspecified communicable disease: Secondary | ICD-10-CM | POA: Diagnosis not present

## 2021-06-24 DIAGNOSIS — E119 Type 2 diabetes mellitus without complications: Secondary | ICD-10-CM | POA: Diagnosis not present

## 2021-06-24 LAB — HEPATIC FUNCTION PANEL
ALT: 22 U/L (ref 0–53)
AST: 21 U/L (ref 0–37)
Albumin: 4.2 g/dL (ref 3.5–5.2)
Alkaline Phosphatase: 91 U/L (ref 39–117)
Bilirubin, Direct: 0.2 mg/dL (ref 0.0–0.3)
Total Bilirubin: 0.9 mg/dL (ref 0.2–1.2)
Total Protein: 7.2 g/dL (ref 6.0–8.3)

## 2021-06-24 LAB — BASIC METABOLIC PANEL
BUN: 16 mg/dL (ref 6–23)
CO2: 23 mEq/L (ref 19–32)
Calcium: 8.9 mg/dL (ref 8.4–10.5)
Chloride: 106 mEq/L (ref 96–112)
Creatinine, Ser: 0.98 mg/dL (ref 0.40–1.50)
GFR: 78.55 mL/min (ref >60.00)
Glucose, Bld: 100 mg/dL — ABNORMAL HIGH (ref 70–99)
Potassium: 4.5 mEq/L (ref 3.5–5.1)
Sodium: 137 mEq/L (ref 135–145)

## 2021-06-24 LAB — CBC WITH DIFFERENTIAL/PLATELET
Basophils Absolute: 0 10*3/uL (ref 0.0–0.1)
Basophils Relative: 0.8 % (ref 0.0–3.0)
Eosinophils Absolute: 0.3 10*3/uL (ref 0.0–0.7)
Eosinophils Relative: 5.3 % — ABNORMAL HIGH (ref 0.0–5.0)
HCT: 34.8 % — ABNORMAL LOW (ref 39.0–52.0)
Hemoglobin: 11.5 g/dL — ABNORMAL LOW (ref 13.0–17.0)
Lymphocytes Relative: 28.7 % (ref 12.0–46.0)
Lymphs Abs: 1.7 10*3/uL (ref 0.7–4.0)
MCHC: 32.9 g/dL (ref 30.0–36.0)
MCV: 88.5 fl (ref 78.0–100.0)
Monocytes Absolute: 0.3 10*3/uL (ref 0.1–1.0)
Monocytes Relative: 4.7 % (ref 3.0–12.0)
Neutro Abs: 3.5 10*3/uL (ref 1.4–7.7)
Neutrophils Relative %: 60.5 % (ref 43.0–77.0)
Platelets: 294 10*3/uL (ref 150.0–400.0)
RBC: 3.94 Mil/uL — ABNORMAL LOW (ref 4.22–5.81)
RDW: 13.7 % (ref 11.5–15.5)
WBC: 5.8 10*3/uL (ref 4.0–10.5)

## 2021-06-24 LAB — LIPID PANEL
Cholesterol: 92 mg/dL (ref 0–200)
HDL: 39.4 mg/dL (ref >39.00)
LDL Cholesterol: 37 mg/dL (ref 0–99)
NonHDL: 52.38
Total CHOL/HDL Ratio: 2
Triglycerides: 75 mg/dL (ref 0.0–149.0)
VLDL: 15 mg/dL (ref 0.0–40.0)

## 2021-06-24 LAB — HEMOGLOBIN A1C: Hgb A1c MFr Bld: 6.4 % (ref 4.6–6.5)

## 2021-06-24 LAB — TSH: TSH: 1.14 u[IU]/mL (ref 0.35–5.50)

## 2021-06-25 LAB — HEPATITIS C ANTIBODY
Hepatitis C Ab: NONREACTIVE
SIGNAL TO CUT-OFF: 0.12 (ref <1.00)

## 2021-06-28 ENCOUNTER — Other Ambulatory Visit: Payer: Self-pay | Admitting: Family Medicine

## 2021-07-01 ENCOUNTER — Other Ambulatory Visit: Payer: Self-pay | Admitting: Family Medicine

## 2021-07-10 DIAGNOSIS — N319 Neuromuscular dysfunction of bladder, unspecified: Secondary | ICD-10-CM | POA: Diagnosis not present

## 2021-07-10 DIAGNOSIS — R339 Retention of urine, unspecified: Secondary | ICD-10-CM | POA: Diagnosis not present

## 2021-08-11 DIAGNOSIS — N319 Neuromuscular dysfunction of bladder, unspecified: Secondary | ICD-10-CM | POA: Diagnosis not present

## 2021-08-11 DIAGNOSIS — R339 Retention of urine, unspecified: Secondary | ICD-10-CM | POA: Diagnosis not present

## 2021-08-17 ENCOUNTER — Other Ambulatory Visit: Payer: Self-pay | Admitting: Family Medicine

## 2021-08-17 DIAGNOSIS — E782 Mixed hyperlipidemia: Secondary | ICD-10-CM

## 2021-08-17 DIAGNOSIS — E119 Type 2 diabetes mellitus without complications: Secondary | ICD-10-CM

## 2021-08-26 ENCOUNTER — Other Ambulatory Visit: Payer: Self-pay | Admitting: Family Medicine

## 2021-09-09 DIAGNOSIS — R339 Retention of urine, unspecified: Secondary | ICD-10-CM | POA: Diagnosis not present

## 2021-09-09 DIAGNOSIS — N319 Neuromuscular dysfunction of bladder, unspecified: Secondary | ICD-10-CM | POA: Diagnosis not present

## 2021-10-11 DIAGNOSIS — R339 Retention of urine, unspecified: Secondary | ICD-10-CM | POA: Diagnosis not present

## 2021-10-11 DIAGNOSIS — N319 Neuromuscular dysfunction of bladder, unspecified: Secondary | ICD-10-CM | POA: Diagnosis not present

## 2021-10-20 ENCOUNTER — Other Ambulatory Visit: Payer: Self-pay | Admitting: Family Medicine

## 2021-11-11 DIAGNOSIS — N319 Neuromuscular dysfunction of bladder, unspecified: Secondary | ICD-10-CM | POA: Diagnosis not present

## 2021-11-11 DIAGNOSIS — R339 Retention of urine, unspecified: Secondary | ICD-10-CM | POA: Diagnosis not present

## 2021-11-12 ENCOUNTER — Other Ambulatory Visit: Payer: Self-pay | Admitting: Family Medicine

## 2021-11-12 DIAGNOSIS — E119 Type 2 diabetes mellitus without complications: Secondary | ICD-10-CM

## 2021-11-12 DIAGNOSIS — E782 Mixed hyperlipidemia: Secondary | ICD-10-CM

## 2021-11-21 ENCOUNTER — Other Ambulatory Visit: Payer: Self-pay | Admitting: Family Medicine

## 2021-12-10 DIAGNOSIS — N319 Neuromuscular dysfunction of bladder, unspecified: Secondary | ICD-10-CM | POA: Diagnosis not present

## 2021-12-10 DIAGNOSIS — R339 Retention of urine, unspecified: Secondary | ICD-10-CM | POA: Diagnosis not present

## 2021-12-19 ENCOUNTER — Other Ambulatory Visit: Payer: Self-pay | Admitting: Family Medicine

## 2022-01-12 DIAGNOSIS — R339 Retention of urine, unspecified: Secondary | ICD-10-CM | POA: Diagnosis not present

## 2022-01-12 DIAGNOSIS — N319 Neuromuscular dysfunction of bladder, unspecified: Secondary | ICD-10-CM | POA: Diagnosis not present

## 2022-01-15 ENCOUNTER — Other Ambulatory Visit: Payer: Self-pay | Admitting: Family Medicine

## 2022-02-09 DIAGNOSIS — N319 Neuromuscular dysfunction of bladder, unspecified: Secondary | ICD-10-CM | POA: Diagnosis not present

## 2022-02-09 DIAGNOSIS — R339 Retention of urine, unspecified: Secondary | ICD-10-CM | POA: Diagnosis not present

## 2022-02-11 ENCOUNTER — Other Ambulatory Visit: Payer: Self-pay | Admitting: Family Medicine

## 2022-02-11 DIAGNOSIS — E782 Mixed hyperlipidemia: Secondary | ICD-10-CM

## 2022-02-11 DIAGNOSIS — E119 Type 2 diabetes mellitus without complications: Secondary | ICD-10-CM

## 2022-03-11 DIAGNOSIS — H40013 Open angle with borderline findings, low risk, bilateral: Secondary | ICD-10-CM | POA: Diagnosis not present

## 2022-03-11 DIAGNOSIS — H35372 Puckering of macula, left eye: Secondary | ICD-10-CM | POA: Diagnosis not present

## 2022-03-11 DIAGNOSIS — H2513 Age-related nuclear cataract, bilateral: Secondary | ICD-10-CM | POA: Diagnosis not present

## 2022-03-11 DIAGNOSIS — E119 Type 2 diabetes mellitus without complications: Secondary | ICD-10-CM | POA: Diagnosis not present

## 2022-03-11 DIAGNOSIS — H524 Presbyopia: Secondary | ICD-10-CM | POA: Diagnosis not present

## 2022-03-11 LAB — HM DIABETES EYE EXAM

## 2022-03-12 DIAGNOSIS — R339 Retention of urine, unspecified: Secondary | ICD-10-CM | POA: Diagnosis not present

## 2022-03-12 DIAGNOSIS — N319 Neuromuscular dysfunction of bladder, unspecified: Secondary | ICD-10-CM | POA: Diagnosis not present

## 2022-04-09 ENCOUNTER — Other Ambulatory Visit: Payer: Self-pay | Admitting: Family Medicine

## 2022-04-10 DIAGNOSIS — N319 Neuromuscular dysfunction of bladder, unspecified: Secondary | ICD-10-CM | POA: Diagnosis not present

## 2022-04-10 DIAGNOSIS — R339 Retention of urine, unspecified: Secondary | ICD-10-CM | POA: Diagnosis not present

## 2022-05-11 DIAGNOSIS — R339 Retention of urine, unspecified: Secondary | ICD-10-CM | POA: Diagnosis not present

## 2022-05-11 DIAGNOSIS — N319 Neuromuscular dysfunction of bladder, unspecified: Secondary | ICD-10-CM | POA: Diagnosis not present

## 2022-05-18 ENCOUNTER — Other Ambulatory Visit: Payer: Self-pay | Admitting: Family Medicine

## 2022-05-18 DIAGNOSIS — E782 Mixed hyperlipidemia: Secondary | ICD-10-CM

## 2022-05-18 DIAGNOSIS — E119 Type 2 diabetes mellitus without complications: Secondary | ICD-10-CM

## 2022-06-11 DIAGNOSIS — R339 Retention of urine, unspecified: Secondary | ICD-10-CM | POA: Diagnosis not present

## 2022-06-11 DIAGNOSIS — N319 Neuromuscular dysfunction of bladder, unspecified: Secondary | ICD-10-CM | POA: Diagnosis not present

## 2022-06-22 ENCOUNTER — Ambulatory Visit (INDEPENDENT_AMBULATORY_CARE_PROVIDER_SITE_OTHER): Payer: Medicare Other

## 2022-06-22 VITALS — Ht 72.0 in

## 2022-06-22 DIAGNOSIS — Z0000 Encounter for general adult medical examination without abnormal findings: Secondary | ICD-10-CM | POA: Diagnosis not present

## 2022-06-22 NOTE — Patient Instructions (Addendum)
Dwayne Simpson , Thank you for taking time to come for your Medicare Wellness Visit. I appreciate your ongoing commitment to your health goals. Please review the following plan we discussed and let me know if I can assist you in the future.   These are the goals we discussed:  Goals       Lose weight (pt-stated)      Watch what I eat.        This is a list of the screening recommended for you and due dates:  Health Maintenance  Topic Date Due   Complete foot exam   Never done   Hemoglobin A1C  12/25/2021   Yearly kidney function blood test for diabetes  06/25/2022   Yearly kidney health urinalysis for diabetes  06/23/2022*   Flu Shot  09/10/2022   DTaP/Tdap/Td vaccine (2 - Td or Tdap) 01/02/2023   Eye exam for diabetics  03/12/2023   Medicare Annual Wellness Visit  06/22/2023   Colon Cancer Screening  11/19/2027   Pneumonia Vaccine  Completed   COVID-19 Vaccine  Completed   Hepatitis C Screening: USPSTF Recommendation to screen - Ages 71-79 yo.  Completed   Zoster (Shingles) Vaccine  Completed   HPV Vaccine  Aged Out  *Topic was postponed. The date shown is not the original due date.    Advanced directives: Advance directive discussed with you today. Even though you declined this today, please call our office should you change your mind, and we can give you the proper paperwork for you to fill out.   Conditions/risks identified: None  Next appointment: Follow up in one year for your annual wellness visit.   Preventive Care 71 Years and Older, Male  Preventive care refers to lifestyle choices and visits with your health care provider that can promote health and wellness. What does preventive care include? A yearly physical exam. This is also called an annual well check. Dental exams once or twice a year. Routine eye exams. Ask your health care provider how often you should have your eyes checked. Personal lifestyle choices, including: Daily care of your teeth and  gums. Regular physical activity. Eating a healthy diet. Avoiding tobacco and drug use. Limiting alcohol use. Practicing safe sex. Taking low doses of aspirin every day. Taking vitamin and mineral supplements as recommended by your health care provider. What happens during an annual well check? The services and screenings done by your health care provider during your annual well check will depend on your age, overall health, lifestyle risk factors, and family history of disease. Counseling  Your health care provider may ask you questions about your: Alcohol use. Tobacco use. Drug use. Emotional well-being. Home and relationship well-being. Sexual activity. Eating habits. History of falls. Memory and ability to understand (cognition). Work and work Astronomer. Screening  You may have the following tests or measurements: Height, weight, and BMI. Blood pressure. Lipid and cholesterol levels. These may be checked every 5 years, or more frequently if you are over 35 years old. Skin check. Lung cancer screening. You may have this screening every year starting at age 16 if you have a 30-pack-year history of smoking and currently smoke or have quit within the past 15 years. Fecal occult blood test (FOBT) of the stool. You may have this test every year starting at age 79. Flexible sigmoidoscopy or colonoscopy. You may have a sigmoidoscopy every 5 years or a colonoscopy every 10 years starting at age 85. Prostate cancer screening. Recommendations will vary depending  on your family history and other risks. Hepatitis C blood test. Hepatitis B blood test. Sexually transmitted disease (STD) testing. Diabetes screening. This is done by checking your blood sugar (glucose) after you have not eaten for a while (fasting). You may have this done every 1-3 years. Abdominal aortic aneurysm (AAA) screening. You may need this if you are a current or former smoker. Osteoporosis. You may be screened  starting at age 40 if you are at high risk. Talk with your health care provider about your test results, treatment options, and if necessary, the need for more tests. Vaccines  Your health care provider may recommend certain vaccines, such as: Influenza vaccine. This is recommended every year. Tetanus, diphtheria, and acellular pertussis (Tdap, Td) vaccine. You may need a Td booster every 10 years. Zoster vaccine. You may need this after age 18. Pneumococcal 13-valent conjugate (PCV13) vaccine. One dose is recommended after age 87. Pneumococcal polysaccharide (PPSV23) vaccine. One dose is recommended after age 30. Talk to your health care provider about which screenings and vaccines you need and how often you need them. This information is not intended to replace advice given to you by your health care provider. Make sure you discuss any questions you have with your health care provider. Document Released: 02/22/2015 Document Revised: 10/16/2015 Document Reviewed: 11/27/2014 Elsevier Interactive Patient Education  2017 [employer] Prevention in the Home Falls can cause injuries. They can happen to people of all ages. There are many things you can do to make your home safe and to help prevent falls. What can I do on the outside of my home? Regularly fix the edges of walkways and driveways and fix any cracks. Remove anything that might make you trip as you walk through a door, such as a raised step or threshold. Trim any bushes or trees on the path to your home. Use bright outdoor lighting. Clear any walking paths of anything that might make someone trip, such as rocks or tools. Regularly check to see if handrails are loose or broken. Make sure that both sides of any steps have handrails. Any raised decks and porches should have guardrails on the edges. Have any leaves, snow, or ice cleared regularly. Use sand or salt on walking paths during winter. Clean up any spills in your garage  right away. This includes oil or grease spills. What can I do in the bathroom? Use night lights. Install grab bars by the toilet and in the tub and shower. Do not use towel bars as grab bars. Use non-skid mats or decals in the tub or shower. If you need to sit down in the shower, use a plastic, non-slip stool. Keep the floor dry. Clean up any water that spills on the floor as soon as it happens. Remove soap buildup in the tub or shower regularly. Attach bath mats securely with double-sided non-slip rug tape. Do not have throw rugs and other things on the floor that can make you trip. What can I do in the bedroom? Use night lights. Make sure that you have a light by your bed that is easy to reach. Do not use any sheets or blankets that are too big for your bed. They should not hang down onto the floor. Have a firm chair that has side arms. You can use this for support while you get dressed. Do not have throw rugs and other things on the floor that can make you trip. What can I do in the kitchen?  Clean up any spills right away. Avoid walking on wet floors. Keep items that you use a lot in easy-to-reach places. If you need to reach something above you, use a strong step stool that has a grab bar. Keep electrical cords out of the way. Do not use floor polish or wax that makes floors slippery. If you must use wax, use non-skid floor wax. Do not have throw rugs and other things on the floor that can make you trip. What can I do with my stairs? Do not leave any items on the stairs. Make sure that there are handrails on both sides of the stairs and use them. Fix handrails that are broken or loose. Make sure that handrails are as long as the stairways. Check any carpeting to make sure that it is firmly attached to the stairs. Fix any carpet that is loose or worn. Avoid having throw rugs at the top or bottom of the stairs. If you do have throw rugs, attach them to the floor with carpet tape. Make  sure that you have a light switch at the top of the stairs and the bottom of the stairs. If you do not have them, ask someone to add them for you. What else can I do to help prevent falls? Wear shoes that: Do not have high heels. Have rubber bottoms. Are comfortable and fit you well. Are closed at the toe. Do not wear sandals. If you use a stepladder: Make sure that it is fully opened. Do not climb a closed stepladder. Make sure that both sides of the stepladder are locked into place. Ask someone to hold it for you, if possible. Clearly mark and make sure that you can see: Any grab bars or handrails. First and last steps. Where the edge of each step is. Use tools that help you move around (mobility aids) if they are needed. These include: Canes. Walkers. Scooters. Crutches. Turn on the lights when you go into a dark area. Replace any light bulbs as soon as they burn out. Set up your furniture so you have a clear path. Avoid moving your furniture around. If any of your floors are uneven, fix them. If there are any pets around you, be aware of where they are. Review your medicines with your doctor. Some medicines can make you feel dizzy. This can increase your chance of falling. Ask your doctor what other things that you can do to help prevent falls. This information is not intended to replace advice given to you by your health care provider. Make sure you discuss any questions you have with your health care provider. Document Released: 11/22/2008 Document Revised: 07/04/2015 Document Reviewed: 03/02/2014 Elsevier Interactive Patient Education  2017 [employer].

## 2022-06-22 NOTE — Progress Notes (Signed)
Subjective:   Dwayne Simpson is a 72 y.o. male who presents for Medicare Annual/Subsequent preventive examination.  Review of Systems    Virtual Visit via Telephone Note  I connected with  Dwayne Simpson on 06/22/22 at 11:30 AM EDT by telephone and verified that I am speaking with the correct person using two identifiers.  Location: Patient: Home Provider: Office Persons participating in the virtual visit: patient/Nurse Health Advisor   I discussed the limitations, risks, security and privacy concerns of performing an evaluation and management service by telephone and the availability of in person appointments. The patient expressed understanding and agreed to proceed.  Interactive audio and video telecommunications were attempted between this nurse and patient, however failed, due to patient having technical difficulties OR patient did not have access to video capability.  We continued and completed visit with audio only.  Some vital signs may be absent or patient reported.   Tillie Rung, LPN  Cardiac Risk Factors include: advanced age (>37men, >50 women);male gender;diabetes mellitus;hypertension     Objective:    Today's Vitals   06/22/22 1129  Height: 6' (1.829 m)   Body mass index is 29.35 kg/m.     06/22/2022   11:41 AM 06/19/2021   11:14 AM 06/18/2020    1:29 PM  Advanced Directives  Does Patient Have a Medical Advance Directive? No No No  Would patient like information on creating a medical advance directive? No - Patient declined No - Patient declined No - Patient declined    Current Medications (verified) Outpatient Encounter Medications as of 06/22/2022  Medication Sig   ACCU-CHEK FASTCLIX LANCETS MISC USE AS DIRECTED TO TEST BLOOD SUGAR DAILY   amLODipine (NORVASC) 10 MG tablet TAKE 1 TABLET(10 MG) BY MOUTH DAILY   aspirin 81 MG tablet Take 1 tablet (81 mg total) by mouth daily. Resume in 48hrs.   atorvastatin (LIPITOR) 20 MG tablet TAKE 1 TABLET(20 MG) BY  MOUTH DAILY   diclofenac Sodium (VOLTAREN) 1 % GEL Apply topically 4 (four) times daily as needed.   glipiZIDE (GLUCOTROL) 10 MG tablet TAKE 1 TABLET(10 MG) BY MOUTH TWICE DAILY BEFORE A MEAL   glucose blood (ACCU-CHEK AVIVA PLUS) test strip TEST ONE EVERY DAY AS DIRECTED   hydrochlorothiazide (HYDRODIURIL) 25 MG tablet TAKE 1 TABLET(25 MG) BY MOUTH DAILY   lisinopril (ZESTRIL) 20 MG tablet TAKE 1 TABLET(20 MG) BY MOUTH DAILY   metFORMIN (GLUCOPHAGE) 500 MG tablet TAKE 1 TABLET(500 MG) BY MOUTH TWICE DAILY WITH A MEAL   metoprolol tartrate (LOPRESSOR) 100 MG tablet TAKE 1 TABLET(100 MG) BY MOUTH TWICE DAILY   pioglitazone (ACTOS) 15 MG tablet TAKE 1 TABLET BY MOUTH EVERY DAY   VITAMIN D, CHOLECALCIFEROL, PO Take 600 Int'l Units/day by mouth daily.   No facility-administered encounter medications on file as of 06/22/2022.    Allergies (verified) Patient has no known allergies.   History: Past Medical History:  Diagnosis Date   Allergic rhinitis    Allergy    seasonal   Arthritis    History of nephrolithiasis    Hyperlipidemia    Hyperlipidemia    Hypertension    Paraplegia (HCC)    from pelvic down--  stands w/ assist and transfers self   Right hydrocele    T12 spinal cord injury (HCC)    1980-- FELL OUT OF TREE   Type 2 diabetes mellitus (HCC)    Wears glasses    Wheelchair bound    Past Surgical History:  Procedure Laterality Date   COLONOSCOPY  11/18/2020   per Dr. Christella Hartigan, adenmatous polyps, repeat in 7 yrs   HYDROCELE EXCISION Right 03/29/2013   Procedure: RIGHT HYDROCELECTOMY ADULT;  Surgeon: Crist Fat, MD;  Location: [facility];  Service: Urology;  Laterality: Right;   INTERNAL AND EXTERNAL HEMORRHOIDECTOMY  07/31/2008   kidney stone     removal with lithotripsy   lysis of the spinal cord  02/09/1978   at T12 injury fell out of  tree    Family History  Problem Relation Age of Onset   Hypertension Mother    Stroke Mother    Pancreatic  cancer Father    Other Sister        Gluten sensitive   Thyroid disease Brother    Coronary artery disease Other    Colon cancer Neg Hx    Colon polyps Neg Hx    Esophageal cancer Neg Hx    Stomach cancer Neg Hx    Rectal cancer Neg Hx    Social History   Socioeconomic History   Marital status: Married    Spouse name: Not on file   Number of children: 3   Years of education: Not on file   Highest education level: Not on file  Occupational History   Occupation: artist retired  Tobacco Use   Smoking status: Former    Packs/day: 0.50    Years: 18.00    Additional pack years: 0.00    Total pack years: 9.00    Types: Cigarettes    Quit date: 03/22/1998    Years since quitting: 24.2   Smokeless tobacco: Never   Tobacco comments:    Off and on smoker until 2000  Vaping Use   Vaping Use: Never used  Substance and Sexual Activity   Alcohol use: Yes    Comment: 2 glasses of wine daily   Drug use: Never   Sexual activity: Not on file  Other Topics Concern   Not on file  Social History Narrative   Married         Social Determinants of Health   Financial Resource Strain: Low Risk  (06/22/2022)   Overall Financial Resource Strain (CARDIA)    Difficulty of Paying Living Expenses: Not hard at all  Food Insecurity: No Food Insecurity (06/22/2022)   Hunger Vital Sign    Worried About Running Out of Food in the Last Year: Never true    Ran Out of Food in the Last Year: Never true  Transportation Needs: No Transportation Needs (06/22/2022)   PRAPARE - Administrator, Civil Service (Medical): No    Lack of Transportation (Non-Medical): No  Physical Activity: Insufficiently Active (06/22/2022)   Exercise Vital Sign    Days of Exercise per Week: 7 days    Minutes of Exercise per Session: 10 min  Stress: No Stress Concern Present (06/22/2022)   [employer] of Occupational Health - Occupational Stress Questionnaire    Feeling of Stress : Not at all  Social  Connections: Moderately Isolated (06/22/2022)   Social Connection and Isolation Panel [NHANES]    Frequency of Communication with Friends and Family: More than three times a week    Frequency of Social Gatherings with Friends and Family: More than three times a week    Attends Religious Services: Never    Database administrator or Organizations: No    Attends Banker Meetings: Never    Marital Status: Married  Tobacco Counseling Counseling given: Not Answered Tobacco comments: Off and on smoker until 2000   Clinical Intake:  Pre-visit preparation completed: Yes  Pain : No/denies pain Nutrition Risk Assessment:  Has the patient had any N/V/D within the last 2 months?  No  Does the patient have any non-healing wounds?  No  Has the patient had any unintentional weight loss or weight gain?  No   Diabetes:  Is the patient diabetic?  Yes  If diabetic, was a CBG obtained today?  No  Did the patient bring in their glucometer from home?  No  How often do you monitor your CBG's? Daily.   Financial Strains and Diabetes Management:  Are you having any financial strains with the device, your supplies or your medication? No .  Does the patient want to be seen by Chronic Care Management for management of their diabetes?  No  Would the patient like to be referred to a Nutritionist or for Diabetic Management?  No   Diabetic Exams:  Diabetic Eye Exam: Completed . Overdue for diabetic eye exam. Pt has been advised about the importance in completing this exam. A referral has been placed today. Message sent to referral coordinator for scheduling purposes. Advised pt to expect a call from office referred to regarding appt.  Diabetic Foot Exam: Completed . Pt has been advised about the importance in completing this exam. Pt is scheduled for diabetic foot exam on Followed by PCP.      BMI - recorded: 0 (W/C) Nutritional Risks: None Diabetes: Yes CBG done?: No Did pt. bring in  CBG monitor from home?: No  How often do you need to have someone help you when you read instructions, pamphlets, or other written materials from your doctor or pharmacy?: 1 - Never  Diabetic?  Yes  Interpreter Needed?: No  Information entered by :: Theresa Mulligan LPN   Activities of Daily Living    06/22/2022   11:37 AM 06/22/2022    4:20 AM  In your present state of health, do you have any difficulty performing the following activities:  Hearing? 0 0  Vision? 0 0  Difficulty concentrating or making decisions? 0 0  Walking or climbing stairs? 1 1  Comment Uses w/c   Dressing or bathing? 0 0  Doing errands, shopping? 1 1  Comment Uses w/c. Wife assist   Preparing Food and eating ? N N  Using the Toilet? N N  In the past six months, have you accidently leaked urine? Y N  Comment Wears breifs. Followed by Urologist   Do you have problems with loss of bowel control? N Y  Managing your Medications? N N  Managing your Finances? N N  Housekeeping or managing your Housekeeping? N N    Patient Care Team: Nelwyn Salisbury, MD as PCP - General  Indicate any recent Medical Services you may have received from other than [facility] providers in the past year (date may be approximate).     Assessment:   This is a routine wellness examination for Dwayne Simpson.  Hearing/Vision screen Hearing Screening - Comments:: Denies hearing difficulties   Vision Screening - Comments:: Wears rx glasses - up to date with routine eye exams with  [facility]  Dietary issues and exercise activities discussed: Current Exercise Habits: Home exercise routine, Type of exercise: stretching, Time (Minutes): 10, Frequency (Times/Week): 7, Weekly Exercise (Minutes/Week): 70, Intensity: Mild, Exercise limited by: orthopedic condition(s)   Goals Addressed  This Visit's Progress     Lose weight (pt-stated)        Watch what I eat.       Depression Screen    06/22/2022   11:35 AM 06/20/2021     8:59 AM 06/19/2021   11:03 AM 06/18/2020    1:31 PM 06/18/2020    1:26 PM 10/25/2017    2:11 PM 08/03/2016    4:47 PM  PHQ 2/9 Scores  PHQ - 2 Score 0 0 0 0 0 0 0  PHQ- 9 Score  0         Fall Risk    06/22/2022   11:40 AM 06/22/2022    4:20 AM 06/20/2021    9:00 AM 06/19/2021   11:11 AM 06/18/2020    1:31 PM  Fall Risk   Falls in the past year? 0 0 0 0 0  Number falls in past yr: 0  0 0 0  Injury with Fall? 0  0 0 0  Risk for fall due to : No Fall Risks   No Fall Risks Impaired balance/gait;Impaired mobility  Follow up Falls prevention discussed  Falls evaluation completed  Falls evaluation completed    FALL RISK PREVENTION PERTAINING TO THE HOME:  Any stairs in or around the home? No If so, are there any without handrails? No  Home free of loose throw rugs in walkways, pet beds, electrical cords, etc? Yes  Adequate lighting in your home to reduce risk of falls? Yes   ASSISTIVE DEVICES UTILIZED TO PREVENT FALLS:  Life alert? No  Use of a cane, walker or w/c? Yes  Grab bars in the bathroom? Yes  Shower chair or bench in shower? Yes  Elevated toilet seat or a handicapped toilet? No  TIMED UP AND GO:  Was the test performed? No . Audio Visit  Cognitive Function:        06/22/2022   11:41 AM 06/19/2021   11:14 AM  6CIT Screen  What Year? 0 points 0 points  What month? 0 points 0 points  What time? 0 points 0 points  Count back from 20 0 points 0 points  Months in reverse 0 points 0 points  Repeat phrase 0 points 0 points  Total Score 0 points 0 points    Immunizations Immunization History  Administered Date(s) Administered   COVID-19, mRNA, vaccine(Comirnaty)12 years and older 11/05/2021   Fluad Quad(high Dose 65+) 12/16/2018, 02/01/2020   Influenza Inj Mdck Quad Pf 11/05/2021   Influenza Split 11/26/2010, 12/16/2011   Influenza Whole 11/07/2007   Influenza, High Dose Seasonal PF 10/09/2016, 10/25/2017   Influenza,inj,Quad PF,6+ Mos 02/01/2013, 10/24/2014    PFIZER(Purple Top)SARS-COV-2 Vaccination 05/18/2019, 02/01/2020   Pneumococcal Conjugate-13 10/25/2017   Pneumococcal Polysaccharide-23 12/16/2018   Rsv, Bivalent, Protein Subunit Rsvpref,pf Verdis Frederickson) 11/05/2021   Tdap 01/01/2013   Zoster Recombinat (Shingrix) 06/24/2021, 11/05/2021    TDAP status: Up to date  Flu Vaccine status: Up to date  Pneumococcal vaccine status: Up to date  Covid-19 vaccine status: Completed vaccines  Qualifies for Shingles Vaccine? Yes   Zostavax completed Yes   Shingrix Completed?: Yes  Screening Tests Health Maintenance  Topic Date Due   FOOT EXAM  Never done   HEMOGLOBIN A1C  12/25/2021   Diabetic kidney evaluation - eGFR measurement  06/25/2022   Diabetic kidney evaluation - Urine ACR  06/23/2022 (Originally 09/16/2016)   INFLUENZA VACCINE  09/10/2022   DTaP/Tdap/Td (2 - Td or Tdap) 01/02/2023   OPHTHALMOLOGY EXAM  03/12/2023  Medicare Annual Wellness (AWV)  06/22/2023   COLONOSCOPY (Pts 45-34yrs Insurance coverage will need to be confirmed)  11/19/2027   Pneumonia Vaccine 22+ Years old  Completed   COVID-19 Vaccine  Completed   Hepatitis C Screening  Completed   Zoster Vaccines- Shingrix  Completed   HPV VACCINES  Aged Out    Health Maintenance  Health Maintenance Due  Topic Date Due   FOOT EXAM  Never done   HEMOGLOBIN A1C  12/25/2021   Diabetic kidney evaluation - eGFR measurement  06/25/2022    Colorectal cancer screening: Type of screening: Colonoscopy. Completed 11/18/20. Repeat every 7 years  Lung Cancer Screening: (Low Dose CT Chest recommended if Age 7-80 years, 30 pack-year currently smoking OR have quit w/in 15years.) does not qualify.     Additional Screening:  Hepatitis C Screening: does qualify;  Deferred  Vision Screening: Recommended annual ophthalmology exams for early detection of glaucoma and other disorders of the eye. Is the patient up to date with their annual eye exam?  Yes  Who is the provider or what is  the name of the office in which the patient attends annual eye exams? [facility] If pt is not established with a provider, would they like to be referred to a provider to establish care? No .   Dental Screening: Recommended annual dental exams for proper oral hygiene  Community Resource Referral / Chronic Care Management  CRR required this visit?  No   CCM required this visit?  No      Plan:     I have personally reviewed and noted the following in the patient's chart:   Medical and social history Use of alcohol, tobacco or illicit drugs  Current medications and supplements including opioid prescriptions. Patient is not currently taking opioid prescriptions. Functional ability and status Nutritional status Physical activity Advanced directives List of other physicians Hospitalizations, surgeries, and ER visits in previous 12 months Vitals Screenings to include cognitive, depression, and falls Referrals and appointments  In addition, I have reviewed and discussed with patient certain preventive protocols, quality metrics, and best practice recommendations. A written personalized care plan for preventive services as well as general preventive health recommendations were provided to patient.     Tillie Rung, LPN   1/61/0960   Nurse Notes: Patient due Diabetic kidney evaluation-Urine ACR and Hemoglobin  A1C

## 2022-06-25 ENCOUNTER — Ambulatory Visit (INDEPENDENT_AMBULATORY_CARE_PROVIDER_SITE_OTHER): Payer: Medicare Other | Admitting: Family Medicine

## 2022-06-25 ENCOUNTER — Encounter: Payer: Self-pay | Admitting: Family Medicine

## 2022-06-25 ENCOUNTER — Other Ambulatory Visit: Payer: Self-pay

## 2022-06-25 VITALS — BP 124/64 | HR 52 | Temp 98.2°F | Ht 72.0 in | Wt 209.0 lb

## 2022-06-25 DIAGNOSIS — R609 Edema, unspecified: Secondary | ICD-10-CM | POA: Diagnosis not present

## 2022-06-25 DIAGNOSIS — E782 Mixed hyperlipidemia: Secondary | ICD-10-CM | POA: Diagnosis not present

## 2022-06-25 DIAGNOSIS — G4700 Insomnia, unspecified: Secondary | ICD-10-CM

## 2022-06-25 DIAGNOSIS — E119 Type 2 diabetes mellitus without complications: Secondary | ICD-10-CM

## 2022-06-25 DIAGNOSIS — T1490XS Injury, unspecified, sequela: Secondary | ICD-10-CM

## 2022-06-25 DIAGNOSIS — D509 Iron deficiency anemia, unspecified: Secondary | ICD-10-CM | POA: Diagnosis not present

## 2022-06-25 DIAGNOSIS — M19041 Primary osteoarthritis, right hand: Secondary | ICD-10-CM

## 2022-06-25 DIAGNOSIS — I10 Essential (primary) hypertension: Secondary | ICD-10-CM | POA: Diagnosis not present

## 2022-06-25 DIAGNOSIS — M19042 Primary osteoarthritis, left hand: Secondary | ICD-10-CM

## 2022-06-25 LAB — IBC + FERRITIN
Ferritin: 42.2 ng/mL (ref 22.0–322.0)
Iron: 76 ug/dL (ref 42–165)
Saturation Ratios: 20.4 % (ref 20.0–50.0)
TIBC: 372.4 ug/dL (ref 250.0–450.0)
Transferrin: 266 mg/dL (ref 212.0–360.0)

## 2022-06-25 LAB — CBC WITH DIFFERENTIAL/PLATELET
Basophils Absolute: 0 10*3/uL (ref 0.0–0.1)
Basophils Relative: 0.5 % (ref 0.0–3.0)
Eosinophils Absolute: 0.3 10*3/uL (ref 0.0–0.7)
Eosinophils Relative: 5 % (ref 0.0–5.0)
HCT: 35.8 % — ABNORMAL LOW (ref 39.0–52.0)
Hemoglobin: 11.9 g/dL — ABNORMAL LOW (ref 13.0–17.0)
Lymphocytes Relative: 18 % (ref 12.0–46.0)
Lymphs Abs: 1.2 10*3/uL (ref 0.7–4.0)
MCHC: 33.3 g/dL (ref 30.0–36.0)
MCV: 89 fl (ref 78.0–100.0)
Monocytes Absolute: 0.3 10*3/uL (ref 0.1–1.0)
Monocytes Relative: 4.6 % (ref 3.0–12.0)
Neutro Abs: 4.9 10*3/uL (ref 1.4–7.7)
Neutrophils Relative %: 71.9 % (ref 43.0–77.0)
Platelets: 301 10*3/uL (ref 150.0–400.0)
RBC: 4.02 Mil/uL — ABNORMAL LOW (ref 4.22–5.81)
RDW: 13.4 % (ref 11.5–15.5)
WBC: 6.8 10*3/uL (ref 4.0–10.5)

## 2022-06-25 LAB — LIPID PANEL
Cholesterol: 93 mg/dL (ref 0–200)
HDL: 44.6 mg/dL (ref >39.00)
LDL Cholesterol: 32 mg/dL (ref 0–99)
NonHDL: 47.99
Total CHOL/HDL Ratio: 2
Triglycerides: 80 mg/dL (ref 0.0–149.0)
VLDL: 16 mg/dL (ref 0.0–40.0)

## 2022-06-25 LAB — HEPATIC FUNCTION PANEL
ALT: 26 U/L (ref 0–53)
AST: 25 U/L (ref 0–37)
Albumin: 4 g/dL (ref 3.5–5.2)
Alkaline Phosphatase: 96 U/L (ref 39–117)
Bilirubin, Direct: 0.2 mg/dL (ref 0.0–0.3)
Total Bilirubin: 0.6 mg/dL (ref 0.2–1.2)
Total Protein: 7.3 g/dL (ref 6.0–8.3)

## 2022-06-25 LAB — BASIC METABOLIC PANEL
BUN: 21 mg/dL (ref 6–23)
CO2: 21 mEq/L (ref 19–32)
Calcium: 8.7 mg/dL (ref 8.4–10.5)
Chloride: 106 mEq/L (ref 96–112)
Creatinine, Ser: 0.97 mg/dL (ref 0.40–1.50)
GFR: 78.97 mL/min (ref >60.00)
Glucose, Bld: 105 mg/dL — ABNORMAL HIGH (ref 70–99)
Potassium: 4.3 mEq/L (ref 3.5–5.1)
Sodium: 135 mEq/L (ref 135–145)

## 2022-06-25 LAB — TSH: TSH: 0.91 u[IU]/mL (ref 0.35–5.50)

## 2022-06-25 LAB — HEMOGLOBIN A1C: Hgb A1c MFr Bld: 6.5 % (ref 4.6–6.5)

## 2022-06-25 MED ORDER — METOPROLOL TARTRATE 100 MG PO TABS: ORAL_TABLET | ORAL | 3 refills | Status: DC

## 2022-06-25 MED ORDER — LISINOPRIL 20 MG PO TABS: 20.0000 mg | ORAL_TABLET | Freq: Every day | ORAL | 3 refills | Status: DC

## 2022-06-25 MED ORDER — ACCU-CHEK AVIVA PLUS VI STRP: ORAL_STRIP | 3 refills | Status: DC

## 2022-06-25 MED ORDER — ACCU-CHEK FASTCLIX LANCETS MISC: 3 refills | Status: DC

## 2022-06-25 NOTE — Progress Notes (Signed)
Subjective:    Patient ID: Dwayne Simpson, male    DOB: Apr 18, 1950, 72 y.o.   MRN: [id number]  HPI Here to follow up on issues. He feels well except he does mention feeling tired a lot. He says he does not sleep well at night. Otherwise, his BP has been stable. His glucoses have been well controlled. His OA is stable. His leg edema has been minimal. He sees his eye doctor every 6 months, and so far thwre has been no sign of retinopathy. He sees Urology once a year.    Review of Systems  Constitutional:  Positive for fatigue.  HENT: Negative.    Eyes: Negative.   Respiratory: Negative.    Cardiovascular: Negative.   Gastrointestinal: Negative.   Genitourinary: Negative.   Musculoskeletal: Negative.   Skin: Negative.   Neurological:  Positive for weakness.  Psychiatric/Behavioral: Negative.         Objective:   Physical Exam Constitutional:      General: He is not in acute distress.    Appearance: Normal appearance. He is well-developed. He is not diaphoretic.     Comments: In his wheelchair   HENT:     Head: Normocephalic and atraumatic.     Right Ear: External ear normal.     Left Ear: External ear normal.     Nose: Nose normal.     Mouth/Throat:     Pharynx: No oropharyngeal exudate.  Eyes:     General: No scleral icterus.       Right eye: No discharge.        Left eye: No discharge.     Conjunctiva/sclera: Conjunctivae normal.     Pupils: Pupils are equal, round, and reactive to light.  Neck:     Thyroid: No thyromegaly.     Vascular: No JVD.     Trachea: No tracheal deviation.  Cardiovascular:     Rate and Rhythm: Normal rate and regular rhythm.     Pulses: Normal pulses.     Heart sounds: Normal heart sounds. No murmur heard.    No friction rub. No gallop.  Pulmonary:     Effort: Pulmonary effort is normal. No respiratory distress.     Breath sounds: Normal breath sounds. No wheezing or rales.  Chest:     Chest wall: No tenderness.  Abdominal:     General:  Bowel sounds are normal. There is no distension.     Palpations: Abdomen is soft. There is no mass.     Tenderness: There is no abdominal tenderness. There is no guarding or rebound.  Genitourinary:    Penis: No tenderness.   Musculoskeletal:        General: No tenderness. Normal range of motion.     Cervical back: Neck supple.  Lymphadenopathy:     Cervical: No cervical adenopathy.  Skin:    General: Skin is warm and dry.     Coloration: Skin is not pale.     Findings: No erythema or rash.  Neurological:     Mental Status: He is alert and oriented to person, place, and time.     Cranial Nerves: No cranial nerve deficit.     Motor: No abnormal muscle tone.     Coordination: Coordination normal.     Deep Tendon Reflexes: Reflexes are normal and symmetric. Reflexes normal.  Psychiatric:        Behavior: Behavior normal.        Thought Content: Thought content normal.  Judgment: Judgment normal.           Assessment & Plan:  His HTN is well controlled. His diabetes seems to be well controlled, but we will check an A1c today. His leg edema and OA are stable. Get fasting labs for lipids, etc. To help with sleep he can try talking 5-10 mg of melatonin at bedtime. We spent a total of (33   ) minutes reviewing records and discussing these issues.  Gershon Crane, MD

## 2022-07-02 ENCOUNTER — Other Ambulatory Visit: Payer: Self-pay

## 2022-07-13 DIAGNOSIS — N401 Enlarged prostate with lower urinary tract symptoms: Secondary | ICD-10-CM | POA: Diagnosis not present

## 2022-07-15 DIAGNOSIS — R339 Retention of urine, unspecified: Secondary | ICD-10-CM | POA: Diagnosis not present

## 2022-07-15 DIAGNOSIS — N319 Neuromuscular dysfunction of bladder, unspecified: Secondary | ICD-10-CM | POA: Diagnosis not present

## 2022-07-20 DIAGNOSIS — R3914 Feeling of incomplete bladder emptying: Secondary | ICD-10-CM | POA: Diagnosis not present

## 2022-07-20 DIAGNOSIS — N319 Neuromuscular dysfunction of bladder, unspecified: Secondary | ICD-10-CM | POA: Diagnosis not present

## 2022-08-12 DIAGNOSIS — N319 Neuromuscular dysfunction of bladder, unspecified: Secondary | ICD-10-CM | POA: Diagnosis not present

## 2022-08-12 DIAGNOSIS — R339 Retention of urine, unspecified: Secondary | ICD-10-CM | POA: Diagnosis not present

## 2022-08-15 ENCOUNTER — Other Ambulatory Visit: Payer: Self-pay | Admitting: Family Medicine

## 2022-08-15 DIAGNOSIS — E119 Type 2 diabetes mellitus without complications: Secondary | ICD-10-CM

## 2022-08-20 ENCOUNTER — Other Ambulatory Visit: Payer: Self-pay | Admitting: Family Medicine

## 2022-08-20 DIAGNOSIS — E119 Type 2 diabetes mellitus without complications: Secondary | ICD-10-CM

## 2022-09-07 DIAGNOSIS — N3000 Acute cystitis without hematuria: Secondary | ICD-10-CM | POA: Diagnosis not present

## 2022-09-07 DIAGNOSIS — N319 Neuromuscular dysfunction of bladder, unspecified: Secondary | ICD-10-CM | POA: Diagnosis not present

## 2022-09-07 DIAGNOSIS — R8271 Bacteriuria: Secondary | ICD-10-CM | POA: Diagnosis not present

## 2022-09-07 DIAGNOSIS — H40013 Open angle with borderline findings, low risk, bilateral: Secondary | ICD-10-CM | POA: Diagnosis not present

## 2022-09-07 DIAGNOSIS — H04123 Dry eye syndrome of bilateral lacrimal glands: Secondary | ICD-10-CM | POA: Diagnosis not present

## 2022-09-07 LAB — HM DIABETES EYE EXAM

## 2022-09-09 ENCOUNTER — Encounter: Payer: Self-pay | Admitting: Family Medicine

## 2022-09-10 DIAGNOSIS — N319 Neuromuscular dysfunction of bladder, unspecified: Secondary | ICD-10-CM | POA: Diagnosis not present

## 2022-09-10 DIAGNOSIS — R339 Retention of urine, unspecified: Secondary | ICD-10-CM | POA: Diagnosis not present

## 2022-09-16 ENCOUNTER — Other Ambulatory Visit: Payer: Self-pay | Admitting: Family Medicine

## 2022-09-19 DIAGNOSIS — N529 Male erectile dysfunction, unspecified: Secondary | ICD-10-CM | POA: Diagnosis not present

## 2022-09-19 DIAGNOSIS — I10 Essential (primary) hypertension: Secondary | ICD-10-CM | POA: Diagnosis not present

## 2022-09-19 DIAGNOSIS — Z7982 Long term (current) use of aspirin: Secondary | ICD-10-CM | POA: Diagnosis not present

## 2022-09-19 DIAGNOSIS — Z973 Presence of spectacles and contact lenses: Secondary | ICD-10-CM | POA: Diagnosis not present

## 2022-09-19 DIAGNOSIS — E785 Hyperlipidemia, unspecified: Secondary | ICD-10-CM | POA: Diagnosis not present

## 2022-09-19 DIAGNOSIS — R269 Unspecified abnormalities of gait and mobility: Secondary | ICD-10-CM | POA: Diagnosis not present

## 2022-09-19 DIAGNOSIS — M1990 Unspecified osteoarthritis, unspecified site: Secondary | ICD-10-CM | POA: Diagnosis not present

## 2022-09-19 DIAGNOSIS — Z008 Encounter for other general examination: Secondary | ICD-10-CM | POA: Diagnosis not present

## 2022-09-21 DIAGNOSIS — R8271 Bacteriuria: Secondary | ICD-10-CM | POA: Diagnosis not present

## 2022-09-21 DIAGNOSIS — N3000 Acute cystitis without hematuria: Secondary | ICD-10-CM | POA: Diagnosis not present

## 2022-09-21 DIAGNOSIS — N319 Neuromuscular dysfunction of bladder, unspecified: Secondary | ICD-10-CM | POA: Diagnosis not present

## 2022-10-02 DIAGNOSIS — R339 Retention of urine, unspecified: Secondary | ICD-10-CM | POA: Diagnosis not present

## 2022-10-02 DIAGNOSIS — N319 Neuromuscular dysfunction of bladder, unspecified: Secondary | ICD-10-CM | POA: Diagnosis not present

## 2022-10-11 DIAGNOSIS — E119 Type 2 diabetes mellitus without complications: Secondary | ICD-10-CM | POA: Diagnosis not present

## 2022-10-14 DIAGNOSIS — R339 Retention of urine, unspecified: Secondary | ICD-10-CM | POA: Diagnosis not present

## 2022-10-14 DIAGNOSIS — N319 Neuromuscular dysfunction of bladder, unspecified: Secondary | ICD-10-CM | POA: Diagnosis not present

## 2022-11-09 ENCOUNTER — Other Ambulatory Visit: Payer: Self-pay | Admitting: Family Medicine

## 2022-11-09 DIAGNOSIS — E782 Mixed hyperlipidemia: Secondary | ICD-10-CM

## 2022-11-10 DIAGNOSIS — E119 Type 2 diabetes mellitus without complications: Secondary | ICD-10-CM | POA: Diagnosis not present

## 2022-12-02 DIAGNOSIS — N319 Neuromuscular dysfunction of bladder, unspecified: Secondary | ICD-10-CM | POA: Diagnosis not present

## 2022-12-02 DIAGNOSIS — R339 Retention of urine, unspecified: Secondary | ICD-10-CM | POA: Diagnosis not present

## 2022-12-11 DIAGNOSIS — E119 Type 2 diabetes mellitus without complications: Secondary | ICD-10-CM | POA: Diagnosis not present

## 2022-12-25 DIAGNOSIS — N319 Neuromuscular dysfunction of bladder, unspecified: Secondary | ICD-10-CM | POA: Diagnosis not present

## 2022-12-25 DIAGNOSIS — R339 Retention of urine, unspecified: Secondary | ICD-10-CM | POA: Diagnosis not present

## 2023-01-10 DIAGNOSIS — E119 Type 2 diabetes mellitus without complications: Secondary | ICD-10-CM | POA: Diagnosis not present

## 2023-01-14 DIAGNOSIS — N319 Neuromuscular dysfunction of bladder, unspecified: Secondary | ICD-10-CM | POA: Diagnosis not present

## 2023-01-14 DIAGNOSIS — R339 Retention of urine, unspecified: Secondary | ICD-10-CM | POA: Diagnosis not present

## 2023-02-08 ENCOUNTER — Other Ambulatory Visit: Payer: Self-pay | Admitting: Family Medicine

## 2023-02-08 DIAGNOSIS — E119 Type 2 diabetes mellitus without complications: Secondary | ICD-10-CM

## 2023-02-10 DIAGNOSIS — E119 Type 2 diabetes mellitus without complications: Secondary | ICD-10-CM | POA: Diagnosis not present

## 2023-02-11 DIAGNOSIS — R339 Retention of urine, unspecified: Secondary | ICD-10-CM | POA: Diagnosis not present

## 2023-02-11 DIAGNOSIS — N319 Neuromuscular dysfunction of bladder, unspecified: Secondary | ICD-10-CM | POA: Diagnosis not present

## 2023-03-13 DIAGNOSIS — N319 Neuromuscular dysfunction of bladder, unspecified: Secondary | ICD-10-CM | POA: Diagnosis not present

## 2023-03-13 DIAGNOSIS — E119 Type 2 diabetes mellitus without complications: Secondary | ICD-10-CM | POA: Diagnosis not present

## 2023-03-13 DIAGNOSIS — R339 Retention of urine, unspecified: Secondary | ICD-10-CM | POA: Diagnosis not present

## 2023-03-15 DIAGNOSIS — E119 Type 2 diabetes mellitus without complications: Secondary | ICD-10-CM | POA: Diagnosis not present

## 2023-03-15 DIAGNOSIS — H43821 Vitreomacular adhesion, right eye: Secondary | ICD-10-CM | POA: Diagnosis not present

## 2023-03-15 DIAGNOSIS — H25813 Combined forms of age-related cataract, bilateral: Secondary | ICD-10-CM | POA: Diagnosis not present

## 2023-03-15 DIAGNOSIS — H40013 Open angle with borderline findings, low risk, bilateral: Secondary | ICD-10-CM | POA: Diagnosis not present

## 2023-04-12 DIAGNOSIS — N319 Neuromuscular dysfunction of bladder, unspecified: Secondary | ICD-10-CM | POA: Diagnosis not present

## 2023-04-12 DIAGNOSIS — R339 Retention of urine, unspecified: Secondary | ICD-10-CM | POA: Diagnosis not present

## 2023-04-29 ENCOUNTER — Other Ambulatory Visit: Payer: Self-pay | Admitting: Family Medicine

## 2023-04-29 DIAGNOSIS — E119 Type 2 diabetes mellitus without complications: Secondary | ICD-10-CM

## 2023-05-09 ENCOUNTER — Other Ambulatory Visit: Payer: Self-pay | Admitting: Family Medicine

## 2023-05-09 DIAGNOSIS — E782 Mixed hyperlipidemia: Secondary | ICD-10-CM

## 2023-05-31 DIAGNOSIS — N319 Neuromuscular dysfunction of bladder, unspecified: Secondary | ICD-10-CM | POA: Diagnosis not present

## 2023-06-09 ENCOUNTER — Other Ambulatory Visit: Payer: Self-pay | Admitting: Family Medicine

## 2023-06-13 ENCOUNTER — Other Ambulatory Visit: Payer: Self-pay | Admitting: Family Medicine

## 2023-06-18 DIAGNOSIS — H25813 Combined forms of age-related cataract, bilateral: Secondary | ICD-10-CM | POA: Diagnosis not present

## 2023-06-18 DIAGNOSIS — H43823 Vitreomacular adhesion, bilateral: Secondary | ICD-10-CM | POA: Diagnosis not present

## 2023-06-18 DIAGNOSIS — H259 Unspecified age-related cataract: Secondary | ICD-10-CM | POA: Diagnosis not present

## 2023-06-26 ENCOUNTER — Other Ambulatory Visit: Payer: Self-pay | Admitting: Family Medicine

## 2023-06-29 ENCOUNTER — Ambulatory Visit (INDEPENDENT_AMBULATORY_CARE_PROVIDER_SITE_OTHER): Admitting: Family Medicine

## 2023-06-29 VITALS — BP 118/64 | HR 45 | Temp 98.1°F | Wt 200.0 lb

## 2023-06-29 DIAGNOSIS — E119 Type 2 diabetes mellitus without complications: Secondary | ICD-10-CM

## 2023-06-29 DIAGNOSIS — N138 Other obstructive and reflux uropathy: Secondary | ICD-10-CM

## 2023-06-29 DIAGNOSIS — G8220 Paraplegia, unspecified: Secondary | ICD-10-CM | POA: Diagnosis not present

## 2023-06-29 DIAGNOSIS — I10 Essential (primary) hypertension: Secondary | ICD-10-CM | POA: Diagnosis not present

## 2023-06-29 DIAGNOSIS — N401 Enlarged prostate with lower urinary tract symptoms: Secondary | ICD-10-CM

## 2023-06-29 DIAGNOSIS — R609 Edema, unspecified: Secondary | ICD-10-CM

## 2023-06-29 DIAGNOSIS — T1490XS Injury, unspecified, sequela: Secondary | ICD-10-CM

## 2023-06-29 DIAGNOSIS — E782 Mixed hyperlipidemia: Secondary | ICD-10-CM

## 2023-06-29 MED ORDER — ACCU-CHEK AVIVA PLUS VI STRP: ORAL_STRIP | 3 refills | Status: AC

## 2023-06-29 MED ORDER — PIOGLITAZONE HCL 15 MG PO TABS: 15.0000 mg | ORAL_TABLET | Freq: Every day | ORAL | 3 refills | Status: AC

## 2023-06-29 MED ORDER — HYDROCHLOROTHIAZIDE 25 MG PO TABS: 25.0000 mg | ORAL_TABLET | Freq: Every day | ORAL | 3 refills | Status: DC

## 2023-06-29 MED ORDER — AMLODIPINE BESYLATE 10 MG PO TABS: 10.0000 mg | ORAL_TABLET | Freq: Every day | ORAL | 3 refills | Status: DC

## 2023-06-29 MED ORDER — GLIPIZIDE 10 MG PO TABS: 10.0000 mg | ORAL_TABLET | Freq: Two times a day (BID) | ORAL | 3 refills | Status: AC

## 2023-06-29 MED ORDER — ACCU-CHEK FASTCLIX LANCETS MISC: 3 refills | Status: AC

## 2023-06-29 NOTE — Progress Notes (Signed)
 Subjective:    Patient ID: Dwayne Simpson, male    DOB: 06/25/1950, 73 y.o.   MRN: [id number]  HPI Here to follow up on issues. He feels well in general. His BP has been stable. He has not checked his glucoses for awhile. His leg edema is stable. He catheterizes himself TID. He gets yearly eye exams. He has no retinopathy but he has developed a cataract.    Review of Systems  Constitutional: Negative.   HENT: Negative.    Eyes: Negative.   Respiratory: Negative.    Cardiovascular: Negative.   Gastrointestinal: Negative.   Genitourinary: Negative.   Musculoskeletal: Negative.   Skin: Negative.   Neurological:  Positive for weakness.  Psychiatric/Behavioral: Negative.         Objective:   Physical Exam Constitutional:      General: He is not in acute distress.    Appearance: He is well-developed. He is not diaphoretic.     Comments: In a wheelchair   HENT:     Head: Normocephalic and atraumatic.     Right Ear: External ear normal.     Left Ear: External ear normal.     Nose: Nose normal.     Mouth/Throat:     Pharynx: No oropharyngeal exudate.  Eyes:     General: No scleral icterus.       Right eye: No discharge.        Left eye: No discharge.     Conjunctiva/sclera: Conjunctivae normal.     Pupils: Pupils are equal, round, and reactive to light.  Neck:     Thyroid : No thyromegaly.     Vascular: No JVD.     Trachea: No tracheal deviation.  Cardiovascular:     Rate and Rhythm: Normal rate and regular rhythm.     Pulses: Normal pulses.     Heart sounds: Normal heart sounds. No murmur heard.    No friction rub. No gallop.  Pulmonary:     Effort: Pulmonary effort is normal. No respiratory distress.     Breath sounds: Normal breath sounds. No wheezing or rales.  Chest:     Chest wall: No tenderness.  Abdominal:     General: Bowel sounds are normal. There is no distension.     Palpations: Abdomen is soft. There is no mass.     Tenderness: There is no abdominal  tenderness. There is no guarding or rebound.  Genitourinary:    Penis: No tenderness.   Musculoskeletal:        General: No tenderness. Normal range of motion.     Cervical back: Neck supple.  Lymphadenopathy:     Cervical: No cervical adenopathy.  Skin:    General: Skin is warm and dry.     Coloration: Skin is not pale.     Findings: No erythema or rash.  Neurological:     General: No focal deficit present.     Mental Status: He is alert and oriented to person, place, and time.     Cranial Nerves: No cranial nerve deficit.     Motor: No abnormal muscle tone.     Coordination: Coordination normal.     Deep Tendon Reflexes: Reflexes are normal and symmetric. Reflexes normal.  Psychiatric:        Mood and Affect: Mood normal.        Behavior: Behavior normal.        Thought Content: Thought content normal.        Judgment: Judgment  normal.           Assessment & Plan:  His HTN and leg edema are stable. We will get labs to check an A1c, lipids, etc. We spent a total of ( 33  ) minutes reviewing records and discussing these issues.  Corita Diego, MD

## 2023-06-30 LAB — CBC WITH DIFFERENTIAL/PLATELET
Basophils Absolute: 0.1 10*3/uL (ref 0.0–0.1)
Basophils Relative: 1.2 % (ref 0.0–3.0)
Eosinophils Absolute: 0.4 10*3/uL (ref 0.0–0.7)
Eosinophils Relative: 7.4 % — ABNORMAL HIGH (ref 0.0–5.0)
HCT: 34.4 % — ABNORMAL LOW (ref 39.0–52.0)
Hemoglobin: 11.3 g/dL — ABNORMAL LOW (ref 13.0–17.0)
Lymphocytes Relative: 31.3 % (ref 12.0–46.0)
Lymphs Abs: 1.6 10*3/uL (ref 0.7–4.0)
MCHC: 32.8 g/dL (ref 30.0–36.0)
MCV: 88.7 fl (ref 78.0–100.0)
Monocytes Absolute: 0.3 10*3/uL (ref 0.1–1.0)
Monocytes Relative: 5.7 % (ref 3.0–12.0)
Neutro Abs: 2.9 10*3/uL (ref 1.4–7.7)
Neutrophils Relative %: 54.4 % (ref 43.0–77.0)
Platelets: 271 10*3/uL (ref 150.0–400.0)
RBC: 3.88 Mil/uL — ABNORMAL LOW (ref 4.22–5.81)
RDW: 14.1 % (ref 11.5–15.5)
WBC: 5.2 10*3/uL (ref 4.0–10.5)

## 2023-06-30 LAB — BASIC METABOLIC PANEL WITH GFR
BUN: 18 mg/dL (ref 6–23)
CO2: 24 meq/L (ref 19–32)
Calcium: 8.8 mg/dL (ref 8.4–10.5)
Chloride: 108 meq/L (ref 96–112)
Creatinine, Ser: 1.12 mg/dL (ref 0.40–1.50)
GFR: 65.98 mL/min (ref >60.00)
Glucose, Bld: 106 mg/dL — ABNORMAL HIGH (ref 70–99)
Potassium: 5.1 meq/L (ref 3.5–5.1)
Sodium: 139 meq/L (ref 135–145)

## 2023-06-30 LAB — HEPATIC FUNCTION PANEL
ALT: 24 U/L (ref 0–53)
AST: 19 U/L (ref 0–37)
Albumin: 3.9 g/dL (ref 3.5–5.2)
Alkaline Phosphatase: 104 U/L (ref 39–117)
Bilirubin, Direct: 0.2 mg/dL (ref 0.0–0.3)
Total Bilirubin: 0.7 mg/dL (ref 0.2–1.2)
Total Protein: 6.6 g/dL (ref 6.0–8.3)

## 2023-06-30 LAB — PSA: PSA: 2.28 ng/mL (ref 0.10–4.00)

## 2023-06-30 LAB — LIPID PANEL
Cholesterol: 102 mg/dL (ref 0–200)
HDL: 44.1 mg/dL (ref >39.00)
LDL Cholesterol: 44 mg/dL (ref 0–99)
NonHDL: 57.55
Total CHOL/HDL Ratio: 2
Triglycerides: 69 mg/dL (ref 0.0–149.0)
VLDL: 13.8 mg/dL (ref 0.0–40.0)

## 2023-06-30 LAB — HEMOGLOBIN A1C: Hgb A1c MFr Bld: 6.4 % (ref 4.6–6.5)

## 2023-06-30 LAB — TSH: TSH: 1.6 u[IU]/mL (ref 0.35–5.50)

## 2023-07-01 ENCOUNTER — Ambulatory Visit: Payer: Self-pay | Admitting: Family Medicine

## 2023-07-14 DIAGNOSIS — R339 Retention of urine, unspecified: Secondary | ICD-10-CM | POA: Diagnosis not present

## 2023-07-14 DIAGNOSIS — N319 Neuromuscular dysfunction of bladder, unspecified: Secondary | ICD-10-CM | POA: Diagnosis not present

## 2023-08-10 ENCOUNTER — Other Ambulatory Visit: Payer: Self-pay | Admitting: Family Medicine

## 2023-08-10 DIAGNOSIS — E782 Mixed hyperlipidemia: Secondary | ICD-10-CM

## 2023-08-12 DIAGNOSIS — R339 Retention of urine, unspecified: Secondary | ICD-10-CM | POA: Diagnosis not present

## 2023-08-12 DIAGNOSIS — N319 Neuromuscular dysfunction of bladder, unspecified: Secondary | ICD-10-CM | POA: Diagnosis not present

## 2023-08-24 DIAGNOSIS — N319 Neuromuscular dysfunction of bladder, unspecified: Secondary | ICD-10-CM | POA: Diagnosis not present

## 2023-08-24 DIAGNOSIS — N3000 Acute cystitis without hematuria: Secondary | ICD-10-CM | POA: Diagnosis not present

## 2023-08-24 DIAGNOSIS — R338 Other retention of urine: Secondary | ICD-10-CM | POA: Diagnosis not present

## 2023-08-24 DIAGNOSIS — R8271 Bacteriuria: Secondary | ICD-10-CM | POA: Diagnosis not present

## 2023-09-06 ENCOUNTER — Other Ambulatory Visit: Payer: Self-pay | Admitting: Family Medicine

## 2023-09-13 DIAGNOSIS — N319 Neuromuscular dysfunction of bladder, unspecified: Secondary | ICD-10-CM | POA: Diagnosis not present

## 2023-09-13 DIAGNOSIS — R339 Retention of urine, unspecified: Secondary | ICD-10-CM | POA: Diagnosis not present

## 2023-09-17 DIAGNOSIS — H25813 Combined forms of age-related cataract, bilateral: Secondary | ICD-10-CM | POA: Diagnosis not present

## 2023-09-17 DIAGNOSIS — H43823 Vitreomacular adhesion, bilateral: Secondary | ICD-10-CM | POA: Diagnosis not present

## 2023-09-21 DIAGNOSIS — H25813 Combined forms of age-related cataract, bilateral: Secondary | ICD-10-CM | POA: Diagnosis not present

## 2023-09-21 DIAGNOSIS — H40013 Open angle with borderline findings, low risk, bilateral: Secondary | ICD-10-CM | POA: Diagnosis not present

## 2023-09-28 ENCOUNTER — Other Ambulatory Visit: Payer: Self-pay | Admitting: Family Medicine

## 2023-09-30 DIAGNOSIS — H2513 Age-related nuclear cataract, bilateral: Secondary | ICD-10-CM | POA: Diagnosis not present

## 2023-09-30 DIAGNOSIS — H268 Other specified cataract: Secondary | ICD-10-CM | POA: Diagnosis not present

## 2023-09-30 DIAGNOSIS — H43821 Vitreomacular adhesion, right eye: Secondary | ICD-10-CM | POA: Diagnosis not present

## 2023-10-04 ENCOUNTER — Other Ambulatory Visit: Payer: Self-pay | Admitting: Family Medicine

## 2023-10-05 DIAGNOSIS — R972 Elevated prostate specific antigen [PSA]: Secondary | ICD-10-CM | POA: Diagnosis not present

## 2023-10-11 DIAGNOSIS — R339 Retention of urine, unspecified: Secondary | ICD-10-CM | POA: Diagnosis not present

## 2023-10-11 DIAGNOSIS — N319 Neuromuscular dysfunction of bladder, unspecified: Secondary | ICD-10-CM | POA: Diagnosis not present

## 2023-10-19 ENCOUNTER — Other Ambulatory Visit: Payer: Self-pay | Admitting: Family Medicine

## 2023-10-19 DIAGNOSIS — E119 Type 2 diabetes mellitus without complications: Secondary | ICD-10-CM

## 2023-11-09 DIAGNOSIS — H2511 Age-related nuclear cataract, right eye: Secondary | ICD-10-CM | POA: Diagnosis not present

## 2023-11-10 DIAGNOSIS — R339 Retention of urine, unspecified: Secondary | ICD-10-CM | POA: Diagnosis not present

## 2023-11-10 DIAGNOSIS — N319 Neuromuscular dysfunction of bladder, unspecified: Secondary | ICD-10-CM | POA: Diagnosis not present

## 2023-11-23 DIAGNOSIS — H2512 Age-related nuclear cataract, left eye: Secondary | ICD-10-CM | POA: Diagnosis not present

## 2023-12-16 ENCOUNTER — Ambulatory Visit (INDEPENDENT_AMBULATORY_CARE_PROVIDER_SITE_OTHER): Admitting: Family Medicine

## 2023-12-16 ENCOUNTER — Encounter: Payer: Self-pay | Admitting: Family Medicine

## 2023-12-16 VITALS — BP 110/60 | HR 46 | Temp 98.3°F | Wt 200.0 lb

## 2023-12-16 DIAGNOSIS — A084 Viral intestinal infection, unspecified: Secondary | ICD-10-CM | POA: Diagnosis not present

## 2023-12-16 NOTE — Progress Notes (Signed)
   Subjective:    Patient ID: Dwayne Simpson, male    DOB: 06-11-1950, 73 y.o.   MRN: [id number]  HPI Here for a week of diarrhea. This started fairly suddenly, and it lasted a few days. It seemed to stop last night, and he has not passed any stools thus far today. He denies any fever or cramps or nausea. No recent travel or antibiotic use. His appetite is back to normal today, and he is drinking plenty of fluids.    Review of Systems  Constitutional: Negative.   Respiratory: Negative.    Cardiovascular: Negative.   Gastrointestinal:  Positive for diarrhea. Negative for abdominal distention, abdominal pain, blood in stool, constipation, nausea, rectal pain and vomiting.       Objective:   Physical Exam Constitutional:      Appearance: Normal appearance. He is not ill-appearing.  Cardiovascular:     Rate and Rhythm: Normal rate and regular rhythm.     Pulses: Normal pulses.     Heart sounds: Normal heart sounds.  Pulmonary:     Effort: Pulmonary effort is normal.     Breath sounds: Normal breath sounds.  Abdominal:     General: Abdomen is flat. Bowel sounds are normal. There is no distension.     Palpations: Abdomen is soft. There is no mass.     Tenderness: There is no abdominal tenderness. There is no right CVA tenderness, left CVA tenderness, guarding or rebound.     Hernia: No hernia is present.  Neurological:     Mental Status: He is alert.           Assessment & Plan:  He has had a viral enteritis, and it seems that this has run its course. He will follow up as needed.  Garnette Olmsted, MD

## 2023-12-29 DIAGNOSIS — R339 Retention of urine, unspecified: Secondary | ICD-10-CM | POA: Diagnosis not present

## 2023-12-29 DIAGNOSIS — N319 Neuromuscular dysfunction of bladder, unspecified: Secondary | ICD-10-CM | POA: Diagnosis not present

## 2023-12-29 DIAGNOSIS — Z9842 Cataract extraction status, left eye: Secondary | ICD-10-CM | POA: Diagnosis not present

## 2024-01-24 DIAGNOSIS — N319 Neuromuscular dysfunction of bladder, unspecified: Secondary | ICD-10-CM | POA: Diagnosis not present

## 2024-01-24 DIAGNOSIS — R339 Retention of urine, unspecified: Secondary | ICD-10-CM | POA: Diagnosis not present

## 2024-02-10 ENCOUNTER — Other Ambulatory Visit: Payer: Self-pay | Admitting: Family Medicine

## 2024-02-10 DIAGNOSIS — E782 Mixed hyperlipidemia: Secondary | ICD-10-CM

## 2024-03-17 NOTE — Progress Notes (Signed)
 HAMZEH TALL                                          MRN: [id number]   03/17/2024   The [facility] Quality Team Specialist reviewed this patient medical record for the purposes of chart review for care gap closure. The following were reviewed: chart review for care gap closure-kidney health evaluation for diabetes:eGFR  and uACR.    [facility]I Quality Team
